# Patient Record
Sex: Male | Born: 1940 | Race: White | Hispanic: No | Marital: Married | Smoking: Never smoker
Health system: Southern US, Community
[De-identification: ages and names within clinical notes are randomized; demographics above are authoritative.]

## PROBLEM LIST (undated history)

## (undated) DIAGNOSIS — A048 Other specified bacterial intestinal infections: Secondary | ICD-10-CM

## (undated) DIAGNOSIS — K209 Esophagitis, unspecified: Secondary | ICD-10-CM

## (undated) DIAGNOSIS — T7840XA Allergy, unspecified, initial encounter: Secondary | ICD-10-CM

## (undated) DIAGNOSIS — N200 Calculus of kidney: Secondary | ICD-10-CM

## (undated) DIAGNOSIS — J309 Allergic rhinitis, unspecified: Secondary | ICD-10-CM

## (undated) DIAGNOSIS — I10 Essential (primary) hypertension: Secondary | ICD-10-CM

## (undated) DIAGNOSIS — K5790 Diverticulosis of intestine, part unspecified, without perforation or abscess without bleeding: Secondary | ICD-10-CM

## (undated) DIAGNOSIS — E785 Hyperlipidemia, unspecified: Secondary | ICD-10-CM

## (undated) DIAGNOSIS — K219 Gastro-esophageal reflux disease without esophagitis: Secondary | ICD-10-CM

## (undated) DIAGNOSIS — D369 Benign neoplasm, unspecified site: Secondary | ICD-10-CM

## (undated) DIAGNOSIS — H269 Unspecified cataract: Secondary | ICD-10-CM

## (undated) DIAGNOSIS — K2980 Duodenitis without bleeding: Secondary | ICD-10-CM

## (undated) HISTORY — DX: Allergic rhinitis, unspecified: J30.9

## (undated) HISTORY — DX: Benign neoplasm, unspecified site: D36.9

## (undated) HISTORY — DX: Allergy, unspecified, initial encounter: T78.40XA

## (undated) HISTORY — PX: COLONOSCOPY: SHX174

## (undated) HISTORY — PX: TONSILLECTOMY: SUR1361

## (undated) HISTORY — DX: Calculus of kidney: N20.0

## (undated) HISTORY — DX: Esophagitis, unspecified: K20.9

## (undated) HISTORY — DX: Essential (primary) hypertension: I10

## (undated) HISTORY — DX: Duodenitis without bleeding: K29.80

## (undated) HISTORY — DX: Other specified bacterial intestinal infections: A04.8

## (undated) HISTORY — DX: Diverticulosis of intestine, part unspecified, without perforation or abscess without bleeding: K57.90

## (undated) HISTORY — DX: Unspecified cataract: H26.9

## (undated) HISTORY — DX: Hyperlipidemia, unspecified: E78.5

## (undated) HISTORY — DX: Gastro-esophageal reflux disease without esophagitis: K21.9

---

## 1998-11-07 ENCOUNTER — Ambulatory Visit (HOSPITAL_COMMUNITY): Admission: RE | Admit: 1998-11-07 | Discharge: 1998-11-07 | Payer: Self-pay | Admitting: Family Medicine

## 1998-11-07 ENCOUNTER — Encounter: Payer: Self-pay | Admitting: Family Medicine

## 2000-06-27 ENCOUNTER — Encounter: Payer: Self-pay | Admitting: Neurosurgery

## 2000-06-27 ENCOUNTER — Ambulatory Visit (HOSPITAL_COMMUNITY): Admission: RE | Admit: 2000-06-27 | Discharge: 2000-06-27 | Payer: Self-pay | Admitting: Neurosurgery

## 2003-09-22 HISTORY — PX: NECK SURGERY: SHX720

## 2004-07-30 ENCOUNTER — Ambulatory Visit (HOSPITAL_COMMUNITY): Admission: RE | Admit: 2004-07-30 | Discharge: 2004-07-30 | Payer: Self-pay | Admitting: Family Medicine

## 2004-08-12 ENCOUNTER — Ambulatory Visit (HOSPITAL_COMMUNITY): Admission: RE | Admit: 2004-08-12 | Discharge: 2004-08-13 | Payer: Self-pay | Admitting: Neurosurgery

## 2011-11-11 ENCOUNTER — Other Ambulatory Visit: Payer: Self-pay | Admitting: Neurosurgery

## 2011-11-11 DIAGNOSIS — M542 Cervicalgia: Secondary | ICD-10-CM

## 2011-11-11 DIAGNOSIS — M5412 Radiculopathy, cervical region: Secondary | ICD-10-CM

## 2011-11-12 ENCOUNTER — Ambulatory Visit
Admission: RE | Admit: 2011-11-12 | Discharge: 2011-11-12 | Disposition: A | Discharge status: Home / Self Care | Payer: Medicare Other | Source: Ambulatory Visit | Attending: Neurosurgery | Admitting: Neurosurgery

## 2011-11-12 DIAGNOSIS — M542 Cervicalgia: Secondary | ICD-10-CM

## 2011-11-12 DIAGNOSIS — M5412 Radiculopathy, cervical region: Secondary | ICD-10-CM

## 2011-11-16 ENCOUNTER — Other Ambulatory Visit: Payer: Self-pay

## 2012-10-07 ENCOUNTER — Other Ambulatory Visit: Payer: Self-pay | Admitting: Family Medicine

## 2012-10-07 ENCOUNTER — Ambulatory Visit
Admission: RE | Admit: 2012-10-07 | Discharge: 2012-10-07 | Disposition: A | Discharge status: Home / Self Care | Payer: Medicare Other | Source: Ambulatory Visit | Attending: Family Medicine | Admitting: Family Medicine

## 2012-10-07 DIAGNOSIS — R1011 Right upper quadrant pain: Secondary | ICD-10-CM

## 2012-10-07 DIAGNOSIS — R194 Change in bowel habit: Secondary | ICD-10-CM

## 2012-10-11 ENCOUNTER — Encounter: Payer: Self-pay | Admitting: Internal Medicine

## 2012-10-22 DIAGNOSIS — K2980 Duodenitis without bleeding: Secondary | ICD-10-CM

## 2012-10-22 DIAGNOSIS — K209 Esophagitis, unspecified without bleeding: Secondary | ICD-10-CM

## 2012-10-22 HISTORY — DX: Esophagitis, unspecified without bleeding: K20.90

## 2012-10-22 HISTORY — DX: Duodenitis without bleeding: K29.80

## 2012-10-24 ENCOUNTER — Encounter: Payer: Self-pay | Admitting: *Deleted

## 2012-11-03 ENCOUNTER — Ambulatory Visit: Payer: Medicare Other | Admitting: Internal Medicine

## 2012-11-07 ENCOUNTER — Ambulatory Visit (INDEPENDENT_AMBULATORY_CARE_PROVIDER_SITE_OTHER): Payer: Medicare Other | Admitting: Internal Medicine

## 2012-11-07 ENCOUNTER — Other Ambulatory Visit (INDEPENDENT_AMBULATORY_CARE_PROVIDER_SITE_OTHER): Payer: Medicare Other

## 2012-11-07 ENCOUNTER — Encounter: Payer: Self-pay | Admitting: Internal Medicine

## 2012-11-07 VITALS — BP 164/86 | HR 72 | Ht 67.0 in | Wt 170.2 lb

## 2012-11-07 DIAGNOSIS — R6889 Other general symptoms and signs: Secondary | ICD-10-CM

## 2012-11-07 DIAGNOSIS — R198 Other specified symptoms and signs involving the digestive system and abdomen: Secondary | ICD-10-CM

## 2012-11-07 DIAGNOSIS — R110 Nausea: Secondary | ICD-10-CM

## 2012-11-07 DIAGNOSIS — R194 Change in bowel habit: Secondary | ICD-10-CM

## 2012-11-07 LAB — CBC WITH DIFFERENTIAL/PLATELET
Basophils Relative: 0.6 % (ref 0.0–3.0)
Eosinophils Relative: 1.3 % (ref 0.0–5.0)
HCT: 44.4 % (ref 39.0–52.0)
Hemoglobin: 15.1 g/dL (ref 13.0–17.0)
Lymphs Abs: 2.8 10*3/uL (ref 0.7–4.0)
MCV: 98.4 fl (ref 78.0–100.0)
Monocytes Absolute: 0.4 10*3/uL (ref 0.1–1.0)
Neutro Abs: 2.9 10*3/uL (ref 1.4–7.7)
Platelets: 244 10*3/uL (ref 150.0–400.0)
WBC: 6.3 10*3/uL (ref 4.5–10.5)

## 2012-11-07 MED ORDER — OMEPRAZOLE 20 MG PO CPDR: 20.0000 mg | DELAYED_RELEASE_CAPSULE | Freq: Every day | ORAL | Status: DC

## 2012-11-07 MED ORDER — ONDANSETRON HCL 4 MG PO TABS: 4.0000 mg | ORAL_TABLET | Freq: Three times a day (TID) | ORAL | Status: DC | PRN

## 2012-11-07 MED ORDER — MOVIPREP 100 G PO SOLR: 1.0000 | Freq: Once | ORAL | Status: DC

## 2012-11-07 NOTE — Progress Notes (Signed)
Aaron Lang 02-21-1941 MRN [id number]   History of Present Illness:  This is a 72 year old healthy appearing white male who has had 3 months history of nausea, initially in the morning but it has now progressed to nausea during the day. His symptoms are not associated with meals. His appetite has remained good and his weight remains stable. He denies any associated symptoms such as abdominal pain, fever, chest pain or dysphagia. He has had mild changes in his bowel habits from one bowel movement a day to 2-3 bowel movements a day which are soft but there is no diarrhea. He has not taken any antibiotics recently. Stool Hemoccults have been negative. His upper abdominal ultrasound showed mild fatty liver but was otherwise normal. He does not smoke or drink alcohol. He does not take any anti-inflammatory agents. He has changed his diet recently to eating more nuts. His medications have been unchanged. He has never had a colonoscopy. He was told to have an ulcer when he was in college and was treated with Tagamet.    Past Medical History  Diagnosis Date  . GERD (gastroesophageal reflux disease)   . Kidney stones   . HLD (hyperlipidemia)   . Allergic rhinitis    Past Surgical History  Procedure Laterality Date  . Neck surgery    . Tonsillectomy      reports that he has never smoked. He has never used smokeless tobacco. He reports that  drinks alcohol. He reports that he does not use illicit drugs. family history includes Diabetes in his brother and mother and Heart disease in his father. No Known Allergies      Review of Systems:Negative for dysphagia abdominal pain weight loss or rectal bleeding  The remainder of the 10 point ROS is negative except as outlined in H&P   Physical Exam: General appearance  Well developed, in no distress. Eyes- non icteric. HEENT nontraumatic, normocephalic. Mouth no lesions, tongue papillated, no cheilosis. Neck supple without adenopathy, thyroid not  enlarged, no carotid bruits, no JVD. Lungs Clear to auscultation bilaterally. Cor normal S1, normal S2, regular rhythm, no murmur,  quiet precordium. Abdomen: Soft relaxed abdomen with normoactive bowel sounds. No tenderness. Liver edge at costal margin. No bruit. Lower abdomen unremarkable. No palpable mass.  Rectal:Small amount of soft Hemoccult negative stool.  Extremities no pedal edema. Skin no lesions. Neurological alert and oriented x 3. Psychological normal mood and affect.  Assessment and Plan:  Problem #62 72 year old white male with 3 months history of nonspecific GI symptoms of nausea without vomiting and mild change in his bowel habits to frequent stools. He is Hemoccult negative. We have discussed the possibility of irritable bowel syndrome or gastroesophageal reflux not controlled on ranitidine. There is also the possibility of bacterial overgrowth causing change in his bowel habits. H. pylori gastritis is another possibility. Symptomatic diverticulosis is a consideration as well. We will check his sprue profile today to rule out a gluten allergy. We will also draw a CBC, sedimentation rate and TSH. We will schedule him for an upper endoscopy with small bowel biopsies and a colonoscopy. I have given him samples of Prilosec 20 mg to take in place of ranitidine. Depending on the findings of the upper and lower endoscopy, we will make further recommendations.   11/07/2012 Aaron Lang

## 2012-11-07 NOTE — Patient Instructions (Addendum)
You have been scheduled for an endoscopy and colonoscopy with propofol. Please follow the written instructions given to you at your visit today. Please pick up your prep at the pharmacy within the next 1-3 days. If you use inhalers (even only as needed) or a CPAP machine, please bring them with you on the day of your procedure.  Your physician has requested that you go to the basement for the following lab work before leaving today: Celiac Panel, CBC, B12, Sed Rate, TSH  We have sent the following medications to your pharmacy for you to pick up at your convenience: Zofran  CC: Dr Windle Guard

## 2012-11-07 NOTE — Addendum Note (Signed)
Addended by: Richardson Chiquito on: 11/07/2012 02:16 PM   Modules accepted: Orders

## 2012-11-08 ENCOUNTER — Encounter: Payer: Self-pay | Admitting: Internal Medicine

## 2012-11-08 ENCOUNTER — Encounter: Payer: Medicare Other | Admitting: Internal Medicine

## 2012-11-08 ENCOUNTER — Ambulatory Visit (AMBULATORY_SURGERY_CENTER): Payer: Medicare Other | Admitting: Internal Medicine

## 2012-11-08 VITALS — BP 159/97 | HR 62 | Temp 98.5°F | Resp 15 | Ht 67.0 in | Wt 170.0 lb

## 2012-11-08 DIAGNOSIS — D126 Benign neoplasm of colon, unspecified: Secondary | ICD-10-CM

## 2012-11-08 DIAGNOSIS — K2970 Gastritis, unspecified, without bleeding: Secondary | ICD-10-CM

## 2012-11-08 DIAGNOSIS — Z1211 Encounter for screening for malignant neoplasm of colon: Secondary | ICD-10-CM

## 2012-11-08 DIAGNOSIS — R198 Other specified symptoms and signs involving the digestive system and abdomen: Secondary | ICD-10-CM

## 2012-11-08 DIAGNOSIS — K2990 Gastroduodenitis, unspecified, without bleeding: Secondary | ICD-10-CM

## 2012-11-08 DIAGNOSIS — K2960 Other gastritis without bleeding: Secondary | ICD-10-CM

## 2012-11-08 DIAGNOSIS — K2980 Duodenitis without bleeding: Secondary | ICD-10-CM

## 2012-11-08 DIAGNOSIS — R6889 Other general symptoms and signs: Secondary | ICD-10-CM

## 2012-11-08 DIAGNOSIS — R110 Nausea: Secondary | ICD-10-CM

## 2012-11-08 LAB — CELIAC PANEL 10: Tissue Transglutaminase Ab, IgA: 5.8 U/mL (ref <20)

## 2012-11-08 MED ORDER — SUCRALFATE 1 G PO TABS: 1.0000 g | ORAL_TABLET | Freq: Two times a day (BID) | ORAL | Status: DC

## 2012-11-08 MED ORDER — OMEPRAZOLE 20 MG PO CPDR: 20.0000 mg | DELAYED_RELEASE_CAPSULE | Freq: Two times a day (BID) | ORAL | Status: DC

## 2012-11-08 MED ORDER — SODIUM CHLORIDE 0.9 % IV SOLN: 500.0000 mL | INTRAVENOUS | Status: DC

## 2012-11-08 NOTE — Patient Instructions (Signed)
YOU HAD AN ENDOSCOPIC PROCEDURE TODAY AT THE [facility]: Refer to the procedure report that was given to you for any specific questions about what was found during the examination.  If the procedure report does not answer your questions, please call your gastroenterologist to clarify.  If you requested that your care partner not be given the details of your procedure findings, then the procedure report has been included in a sealed envelope for you to review at your convenience later.  YOU SHOULD EXPECT: Some feelings of bloating in the abdomen. Passage of more gas than usual.  Walking can help get rid of the air that was put into your GI tract during the procedure and reduce the bloating. If you had a lower endoscopy (such as a colonoscopy or flexible sigmoidoscopy) you may notice spotting of blood in your stool or on the toilet paper. If you underwent a bowel prep for your procedure, then you may not have a normal bowel movement for a few days.  DIET: Your first meal following the procedure should be a light meal and then it is ok to progress to your normal diet.  A half-sandwich or bowl of soup is an example of a good first meal.  Heavy or fried foods are harder to digest and may make you feel nauseous or bloated.  Likewise meals heavy in dairy and vegetables can cause extra gas to form and this can also increase the bloating.  Drink plenty of fluids but you should avoid alcoholic beverages for 24 hours.  ACTIVITY: Your care partner should take you home directly after the procedure.  You should plan to take it easy, moving slowly for the rest of the day.  You can resume normal activity the day after the procedure however you should NOT DRIVE or use heavy machinery for 24 hours (because of the sedation medicines used during the test).    SYMPTOMS TO REPORT IMMEDIATELY: A gastroenterologist can be reached at any hour.  During normal business hours, 8:30 AM to 5:00 PM Monday through Friday,  call [phone].  After hours and on weekends, please call the GI answering service at [phone] who will take a message and have the physician on call contact you.   Following lower endoscopy (colonoscopy or flexible sigmoidoscopy):  Excessive amounts of blood in the stool  Significant tenderness or worsening of abdominal pains  Swelling of the abdomen that is new, acute  Fever of 100F or higher  Following upper endoscopy (EGD)  Vomiting of blood or coffee ground material  New chest pain or pain under the shoulder blades  Painful or persistently difficult swallowing  New shortness of breath  Fever of 100F or higher  Black, tarry-looking stools  FOLLOW UP: If any biopsies were taken you will be contacted by phone or by letter within the next 1-3 weeks.  Call your gastroenterologist if you have not heard about the biopsies in 3 weeks.  Our staff will call the home number listed on your records the next business day following your procedure to check on you and address any questions or concerns that you may have at that time regarding the information given to you following your procedure. This is a courtesy call and so if there is no answer at the home number and we have not heard from you through the emergency physician on call, we will assume that you have returned to your regular daily activities without incident.  SIGNATURES/CONFIDENTIALITY: You and/or your care   partner have signed paperwork which will be entered into your electronic medical record.  These signatures attest to the fact that that the information above on your After Visit Summary has been reviewed and is understood.  Full responsibility of the confidentiality of this discharge information lies with you and/or your care-partner.  

## 2012-11-08 NOTE — Progress Notes (Signed)
Patient did not experience any of the following events: a burn prior to discharge; a fall within the facility; wrong site/side/patient/procedure/implant event; or a hospital transfer or hospital admission upon discharge from the facility. (G8907) Patient did not have preoperative order for IV antibiotic SSI prophylaxis. (G8918)  

## 2012-11-08 NOTE — Progress Notes (Signed)
Called to room to assist during endoscopic procedure.  Patient ID and intended procedure confirmed with present staff. Received instructions for my participation in the procedure from the performing physician.  

## 2012-11-08 NOTE — Op Note (Signed)
[facility] [address] [place], [address]   COLONOSCOPY PROCEDURE REPORT  PATIENT: Aaron Lang, Aaron Lang  MR#: [id number] BIRTHDATE: [birth year] , 72  yrs. old GENDER: Male ENDOSCOPIST: Hart Carwin, MD REFERRED BY:  Windle Guard, M.D. PROCEDURE DATE:  11/08/2012 PROCEDURE:   Colonoscopy with cold biopsy polypectomy and Colonoscopy with snare polypectomy ASA CLASS:   Class II INDICATIONS:Average risk patient for colon cancer. MEDICATIONS: MAC sedation, administered by CRNA and propofol (Diprivan) 300mg  IV  DESCRIPTION OF PROCEDURE:   After the risks and benefits and of the procedure were explained, informed consent was obtained.  A digital rectal exam revealed no abnormalities of the rectum.    The LB CF-H180AL [id number]  endoscope was introduced through the anus and advanced to the cecum, which was identified by both the appendix and ileocecal valve .  The quality of the prep was good, using MoviPrep .  The instrument was then slowly withdrawn as the colon was fully examined.     COLON FINDINGS: Six  sessile polyps with friable surfaces were found at the cecum and in the ascending colon., sizes range from 3mm x3 polyps, 15 mm x 2 polyps and 5 mm  on the ileo cecal valve.  A polypectomy was performed using snare cautery, with a cold snare and with cold forceps.  The resection was complete and the polyp tissue was completely retrieved.   There was moderate diverticulosis noted in the sigmoid colon with associated tortuosity, muscular hypertrophy, colonic narrowing and angulation. Retroflexed views revealed no abnormalities.     The scope was then withdrawn from the patient and the procedure completed.  COMPLICATIONS: There were no complications. ENDOSCOPIC IMPRESSION: 1.   Six sessile polyps were found at the cecum and in the ascending colon; polypectomy was performed using snare cautery, with a cold snare and with cold forceps 2.   There was moderate  diverticulosis noted in the sigmoid colon  RECOMMENDATIONS: 1.  Await pathology results 2.  High fiber diet starting tomorrow, full liquids today, watch for bleeding and abdominal pain 3. bedin metamucil 2 tsp daily starting next week 4. OV 4 weeks    REPEAT EXAM: for Colonoscopy, pending pathology results.  cc:  _______________________________ eSignedHart Carwin, MD 11/08/2012 5:36 PM     PATIENT NAME:  [name], [name] MR#: [id number]

## 2012-11-08 NOTE — Op Note (Signed)
[facility] [address] [place], [address]   ENDOSCOPY PROCEDURE REPORT  PATIENT: Aaron, Lang  MR#: [id number] BIRTHDATE: May 30, 1941 , 72  yrs. old GENDER: Male ENDOSCOPIST: Hart Carwin, MD REFERRED BY:  Windle Guard, M.D. PROCEDURE DATE:  11/08/2012 PROCEDURE:  EGD w/ biopsy ASA CLASS:     Class II INDICATIONS:  Nausea.   3 month hx of nausea, no vomiting or abdominal pain,change in bowl habits,. MEDICATIONS: MAC sedation, administered by CRNA and propofol (Diprivan) 200mg  IV TOPICAL ANESTHETIC: none  DESCRIPTION OF PROCEDURE: After the risks benefits and alternatives of the procedure were thoroughly explained, informed consent was obtained.  The LB GIF-H180 [id number] endoscope was introduced through the mouth and advanced to the second portion of the duodenum. Without limitations.  The instrument was slowly withdrawn as the mucosa was fully examined.      esophagus: Esophageal mucosa appears normal in proximal and midesophagus. There were several short erosions at the GE junction consistent with grade 1 esophagitis, there was no stricture[ stomach: Endoscope passed into the stomach and stomach was insufflated with air. There was a diffuse pangastritis and the mucosal hemorrhages. Multiple biopsies were taken from gastric antrum. There was no discrete ulcer. Pyloric outlet was normal. Retroflexion of the endoscope revealed the gastritis in the fundus and cardia there were no varices. Duodenum: Duodenal bulb showed mild edema and duodenitis the dissection portion duodenum was normal. Multiple biopsies were taken from the descending  duodenum and from the duodenal bulb to r/o H.pylori       The scope was then withdrawn from the patient and the procedure completed.  COMPLICATIONS: There were no complications. ENDOSCOPIC IMPRESSION:  grade 1 reflux esophagitis moderately severe pangastritis status post multiple biopsies Mild duodenitis status  post biopsies to rule out H. pylori The above findings the may be responsible for patient's symptoms of nausea RECOMMENDATIONS:  increase Prilosec to 20 mg twice a day await results of the biopsies, Add Carafate 1 g by mouth twice a day Continue Zofran 4 mg every 6-8 hours when necessary Proceed with colonoscopy  REPEAT EXAM: no  eSigned:  Hart Carwin, MD 11/08/2012 5:19 PM   CC:  PATIENT NAME:  Aaron, Lang MR#: [id number]

## 2012-11-09 ENCOUNTER — Telehealth: Payer: Self-pay | Admitting: *Deleted

## 2012-11-09 NOTE — Telephone Encounter (Signed)
No answer, left message to call if questions or concerns. 

## 2012-11-10 ENCOUNTER — Telehealth: Payer: Self-pay | Admitting: Internal Medicine

## 2012-11-10 ENCOUNTER — Encounter: Payer: Self-pay | Admitting: *Deleted

## 2012-11-10 NOTE — Telephone Encounter (Signed)
I have spoken to the patient, he had 3 bloody stools earlier  today, has stayed on soup, ne pain,  I have asked him to call in am [facility] with status _ if more bleeding, please have him come back for CBC.

## 2012-11-10 NOTE — Telephone Encounter (Signed)
Patient calling to report he is still having a small amount of blood in stool. Reports the blood is in the toilet only and not when he wipes. Also, still having loose stools(ECOL on 11/08/12) Also, reports that today his nausea has returned and is extreme. He took a Zofran without relief. Please, advise.

## 2012-11-11 NOTE — Telephone Encounter (Signed)
Spoke with patient he had a stool this AM and he did not see blood in it. He also states his temperature is back to normal this morning. He understands to call me back if he sees any blood in stools today.

## 2012-11-14 ENCOUNTER — Encounter: Payer: Self-pay | Admitting: Internal Medicine

## 2012-11-15 ENCOUNTER — Other Ambulatory Visit: Payer: Self-pay | Admitting: *Deleted

## 2012-11-15 MED ORDER — CLARITHROMYCIN 500 MG PO TABS: ORAL_TABLET | ORAL | Status: DC

## 2012-11-15 MED ORDER — AMOXICILLIN 500 MG PO CAPS: ORAL_CAPSULE | ORAL | Status: DC

## 2012-11-21 ENCOUNTER — Encounter: Payer: Self-pay | Admitting: *Deleted

## 2012-12-07 ENCOUNTER — Ambulatory Visit: Payer: Medicare Other | Admitting: Internal Medicine

## 2012-12-09 ENCOUNTER — Ambulatory Visit: Payer: Medicare Other | Admitting: Internal Medicine

## 2012-12-14 ENCOUNTER — Ambulatory Visit (INDEPENDENT_AMBULATORY_CARE_PROVIDER_SITE_OTHER): Payer: Medicare Other | Admitting: Internal Medicine

## 2012-12-14 ENCOUNTER — Encounter: Payer: Self-pay | Admitting: Internal Medicine

## 2012-12-14 VITALS — BP 138/98 | HR 69 | Ht 67.0 in | Wt 179.0 lb

## 2012-12-14 DIAGNOSIS — K2960 Other gastritis without bleeding: Secondary | ICD-10-CM

## 2012-12-14 DIAGNOSIS — R112 Nausea with vomiting, unspecified: Secondary | ICD-10-CM

## 2012-12-14 MED ORDER — SUCRALFATE 1 G PO TABS: 1.0000 g | ORAL_TABLET | Freq: Two times a day (BID) | ORAL | Status: DC

## 2012-12-14 NOTE — Progress Notes (Signed)
Aaron Lang Nov 30, 1940 MRN [id number]   History of Present Illness:  This is a 73 year old white male with chronic nausea without vomiting who was found to have H. pylori gastritis on an upper endoscopy 11/08/2012. He also had duodenitis. He was treated with triple therapy and has become symptomatically improved after taking the antibiotics for 10 days. He is now about 80% improved. His nausea occurs only rarely and only in the mornings. It is usually responsive to Carafate 1 g by mouth twice a day.   Past Medical History  Diagnosis Date  . GERD (gastroesophageal reflux disease)   . Kidney stones   . HLD (hyperlipidemia)   . Allergic rhinitis   . Esophagitis 10/2012  . Duodenitis 10/2012  . Diverticulosis   . Tubular adenoma   . H. pylori infection    Past Surgical History  Procedure Laterality Date  . Neck surgery    . Tonsillectomy      reports that he has never smoked. He has never used smokeless tobacco. He reports that  drinks alcohol. He reports that he does not use illicit drugs. family history includes Diabetes in his brother and mother and Heart disease in his father. No Known Allergies      Review of Systems: Denies vomiting. No change in bowel habits  The remainder of the 10 point ROS is negative except as outlined in H&P   Physical Exam: General appearance  Well developed, in no distress. Eyes- non icteric. HEENT nontraumatic, normocephalic. Mouth no lesions, tongue papillated, no cheilosis. Neck supple without adenopathy, thyroid not enlarged, no carotid bruits, no JVD. Lungs Clear to auscultation bilaterally. Cor normal S1, normal S2, regular rhythm, no murmur,  quiet precordium. Abdomen: Soft nontender. Rectal: Not examined. Extremities no pedal edema. Skin no lesions. Neurological alert and oriented x 3. Psychological normal mood and affect.  Assessment and Plan:  Problem #52 72 year old white male with H. pylori gastritis and duodenitis improved on  H. pylori regimen. He still has some nausea and we need to know whether it is caused by H. pylori and if so, he may have to be retreated with another regimen. We will schedule a [employer] breath test to check for the effectiveness of the treatment. He needs a refill of Carafate. We will consider a gastric emptying scan in the future if symptoms return.   12/14/2012 [name]

## 2012-12-14 NOTE — Patient Instructions (Addendum)
We have sent the following medications to your pharmacy for you to pick up at your convenience: Carafate   You have been scheduled for an H. Pylori/Urea Breath Test. Your appointment is on 01/17/13 at 8:15 am. Please come to [facility] 3rd Floor for check in. Please plan on being here for around 20-30 minutes.  Please follow the instructions below to prepare for your test:  1. 01/16/13 (one day prior to your test) avoid high gas foods, high fiber goods, fruits and fruit juices, vegetables, beans, cereals, fiber supplements, onion and bell peppers. You may eat hamburger, chicken, beef, tuna fish and eggs.  2. You should have nothing to eat or drink after 9:00 pm on 01/16/13.   3. Do not smoke, chew gum or eat hard candy the morning of your test.  4. You may not have the test within 2 weeks after taking any antibiotics or within 4 weeks if confirming eradication of H.Pylori.  5. On 01/02/13 (2 weeks prior to your test), stop any Pepto-Bismol and/or proton pump inhibitors (Prevacid, Zegerid, Nexium, Prilosec, Protonix, Aciphex, Dexilant).  6. You may use over the counter strength Axid, Pepcid, Tagamet and Zantac 2 weeks prior to your tests. However, you should STOP these 24 hours before the test. You may use Tums and Maalox.  CC: Dr Windle Guard

## 2013-01-23 ENCOUNTER — Telehealth: Payer: Self-pay | Admitting: Internal Medicine

## 2013-01-23 NOTE — Telephone Encounter (Signed)
Patient given results(negative) per report on MD desk.

## 2013-04-26 ENCOUNTER — Other Ambulatory Visit: Payer: Self-pay

## 2013-07-27 ENCOUNTER — Other Ambulatory Visit: Payer: Self-pay

## 2013-09-21 HISTORY — PX: BACK SURGERY: SHX140

## 2013-10-11 ENCOUNTER — Other Ambulatory Visit: Payer: Self-pay | Admitting: Family Medicine

## 2013-10-11 DIAGNOSIS — M7918 Myalgia, other site: Secondary | ICD-10-CM

## 2013-10-18 ENCOUNTER — Ambulatory Visit
Admission: RE | Admit: 2013-10-18 | Discharge: 2013-10-18 | Disposition: A | Discharge status: Home / Self Care | Payer: Medicare Other | Source: Ambulatory Visit | Attending: Family Medicine | Admitting: Family Medicine

## 2013-10-18 DIAGNOSIS — M7918 Myalgia, other site: Secondary | ICD-10-CM

## 2014-07-06 ENCOUNTER — Other Ambulatory Visit: Payer: Self-pay

## 2015-03-18 ENCOUNTER — Other Ambulatory Visit: Payer: Self-pay

## 2015-07-25 ENCOUNTER — Emergency Department (HOSPITAL_COMMUNITY): Payer: Medicare Other

## 2015-07-25 ENCOUNTER — Observation Stay (HOSPITAL_COMMUNITY)
Admission: EM | Admit: 2015-07-25 | Discharge: 2015-07-26 | Disposition: A | Discharge status: Home / Self Care | Payer: Medicare Other | Attending: Cardiovascular Disease | Admitting: Cardiovascular Disease

## 2015-07-25 ENCOUNTER — Encounter (HOSPITAL_COMMUNITY): Payer: Self-pay

## 2015-07-25 DIAGNOSIS — K219 Gastro-esophageal reflux disease without esophagitis: Secondary | ICD-10-CM | POA: Diagnosis not present

## 2015-07-25 DIAGNOSIS — R079 Chest pain, unspecified: Secondary | ICD-10-CM | POA: Diagnosis not present

## 2015-07-25 DIAGNOSIS — R0789 Other chest pain: Principal | ICD-10-CM | POA: Insufficient documentation

## 2015-07-25 DIAGNOSIS — K2980 Duodenitis without bleeding: Secondary | ICD-10-CM | POA: Diagnosis present

## 2015-07-25 DIAGNOSIS — E785 Hyperlipidemia, unspecified: Secondary | ICD-10-CM | POA: Diagnosis not present

## 2015-07-25 DIAGNOSIS — I209 Angina pectoris, unspecified: Secondary | ICD-10-CM | POA: Diagnosis present

## 2015-07-25 DIAGNOSIS — I10 Essential (primary) hypertension: Secondary | ICD-10-CM | POA: Diagnosis present

## 2015-07-25 DIAGNOSIS — I200 Unstable angina: Secondary | ICD-10-CM | POA: Diagnosis present

## 2015-07-25 DIAGNOSIS — R110 Nausea: Secondary | ICD-10-CM

## 2015-07-25 DIAGNOSIS — Z8619 Personal history of other infectious and parasitic diseases: Secondary | ICD-10-CM | POA: Diagnosis present

## 2015-07-25 LAB — CREATININE, SERUM: Creatinine, Ser: 1.05 mg/dL (ref 0.61–1.24)

## 2015-07-25 LAB — CBC
HEMATOCRIT: 40.6 % (ref 39.0–52.0)
HEMATOCRIT: 40.4 % (ref 39.0–52.0)
HEMOGLOBIN: 13.7 g/dL (ref 13.0–17.0)
Hemoglobin: 13.8 g/dL (ref 13.0–17.0)
MCH: 33.3 pg (ref 26.0–34.0)
MCH: 33.1 pg (ref 26.0–34.0)
MCHC: 34 g/dL (ref 30.0–36.0)
MCHC: 33.9 g/dL (ref 30.0–36.0)
MCV: 97.8 fL (ref 78.0–100.0)
MCV: 97.6 fL (ref 78.0–100.0)
Platelets: 222 10*3/uL (ref 150–400)
Platelets: 237 10*3/uL (ref 150–400)
RBC: 4.15 MIL/uL — AB (ref 4.22–5.81)
RBC: 4.14 MIL/uL — ABNORMAL LOW (ref 4.22–5.81)
RDW: 13.3 % (ref 11.5–15.5)
RDW: 13.3 % (ref 11.5–15.5)
WBC: 6.2 10*3/uL (ref 4.0–10.5)
WBC: 6.6 10*3/uL (ref 4.0–10.5)

## 2015-07-25 LAB — I-STAT TROPONIN, ED
TROPONIN I, POC: 0.01 ng/mL (ref 0.00–0.08)
Troponin i, poc: 0 ng/mL (ref 0.00–0.08)

## 2015-07-25 LAB — BASIC METABOLIC PANEL
Anion gap: 9 (ref 5–15)
BUN: 16 mg/dL (ref 6–20)
CHLORIDE: 108 mmol/L (ref 101–111)
CO2: 23 mmol/L (ref 22–32)
CREATININE: 1 mg/dL (ref 0.61–1.24)
Calcium: 9.4 mg/dL (ref 8.9–10.3)
GFR calc non Af Amer: >60 mL/min (ref >60)
Glucose, Bld: 116 mg/dL — ABNORMAL HIGH (ref 65–99)
POTASSIUM: 4.1 mmol/L (ref 3.5–5.1)
SODIUM: 140 mmol/L (ref 135–145)

## 2015-07-25 LAB — DIFFERENTIAL
Basophils Absolute: 0 10*3/uL (ref 0.0–0.1)
Basophils Relative: 0 %
Eosinophils Absolute: 0.1 10*3/uL (ref 0.0–0.7)
Eosinophils Relative: 2 %
LYMPHS ABS: 3.7 10*3/uL (ref 0.7–4.0)
LYMPHS PCT: 60 %
MONO ABS: 0.6 10*3/uL (ref 0.1–1.0)
MONOS PCT: 9 %
NEUTROS ABS: 1.8 10*3/uL (ref 1.7–7.7)
NEUTROS PCT: 29 %

## 2015-07-25 LAB — TROPONIN I
Troponin I: <0.03 ng/mL (ref <0.031)
Troponin I: <0.03 ng/mL (ref <0.031)

## 2015-07-25 MED ORDER — NITROGLYCERIN 0.4 MG SL SUBL
0.4000 mg | SUBLINGUAL_TABLET | SUBLINGUAL | Status: DC | PRN
Start: 2015-07-25 — End: 2015-07-25
  Administered 2015-07-25: 0.4 mg via SUBLINGUAL
  Filled 2015-07-25: qty 1

## 2015-07-25 MED ORDER — MORPHINE SULFATE (PF) 4 MG/ML IV SOLN
4.0000 mg | Freq: Once | INTRAVENOUS | Status: AC
  Administered 2015-07-25: 4 mg via INTRAVENOUS
  Filled 2015-07-25 (×2): qty 1

## 2015-07-25 MED ORDER — ONDANSETRON HCL 4 MG/2ML IJ SOLN
4.0000 mg | Freq: Once | INTRAMUSCULAR | Status: AC
  Administered 2015-07-25: 4 mg via INTRAVENOUS
  Filled 2015-07-25: qty 2

## 2015-07-25 MED ORDER — ASPIRIN EC 81 MG PO TBEC
324.0000 mg | DELAYED_RELEASE_TABLET | Freq: Once | ORAL | Status: AC
  Administered 2015-07-25: 324 mg via ORAL
  Filled 2015-07-25: qty 4

## 2015-07-25 MED ORDER — HEPARIN SODIUM (PORCINE) 5000 UNIT/ML IJ SOLN
5000.0000 [IU] | Freq: Three times a day (TID) | INTRAMUSCULAR | Status: DC
Start: 2015-07-25 — End: 2015-07-26
  Administered 2015-07-25 – 2015-07-26 (×2): 5000 [IU] via SUBCUTANEOUS
  Filled 2015-07-25 (×3): qty 1

## 2015-07-25 MED ORDER — ASPIRIN 81 MG PO CHEW: 324.0000 mg | CHEWABLE_TABLET | Freq: Once | ORAL | Status: DC

## 2015-07-25 MED ORDER — NITROGLYCERIN 2 % TD OINT
1.0000 [in_us] | TOPICAL_OINTMENT | Freq: Once | TRANSDERMAL | Status: AC
  Administered 2015-07-25: 1 [in_us] via TOPICAL
  Filled 2015-07-25: qty 1

## 2015-07-25 MED ORDER — ACETAMINOPHEN 325 MG PO TABS
650.0000 mg | ORAL_TABLET | ORAL | Status: DC | PRN
  Administered 2015-07-25: 650 mg via ORAL
  Filled 2015-07-25: qty 2

## 2015-07-25 MED ORDER — ADULT MULTIVITAMIN W/MINERALS CH
1.0000 | ORAL_TABLET | Freq: Every day | ORAL | Status: DC
  Administered 2015-07-25 – 2015-07-26 (×2): 1 via ORAL
  Filled 2015-07-25 (×3): qty 1

## 2015-07-25 MED ORDER — ONDANSETRON HCL 4 MG/2ML IJ SOLN
4.0000 mg | Freq: Once | INTRAMUSCULAR | Status: DC | PRN
  Filled 2015-07-25 (×2): qty 2

## 2015-07-25 MED ORDER — FAMOTIDINE IN NACL 20-0.9 MG/50ML-% IV SOLN
20.0000 mg | Freq: Once | INTRAVENOUS | Status: AC
  Administered 2015-07-25: 20 mg via INTRAVENOUS
  Filled 2015-07-25: qty 50

## 2015-07-25 MED ORDER — NITROGLYCERIN 0.4 MG SL SUBL
0.4000 mg | SUBLINGUAL_TABLET | SUBLINGUAL | Status: DC | PRN
  Filled 2015-07-25: qty 1

## 2015-07-25 MED ORDER — ONDANSETRON HCL 4 MG/2ML IJ SOLN: 4.0000 mg | Freq: Four times a day (QID) | INTRAMUSCULAR | Status: DC | PRN

## 2015-07-25 MED ORDER — GI COCKTAIL ~~LOC~~
30.0000 mL | Freq: Once | ORAL | Status: AC
  Administered 2015-07-25: 30 mL via ORAL
  Filled 2015-07-25: qty 30

## 2015-07-25 MED ORDER — PANTOPRAZOLE SODIUM 40 MG PO TBEC
40.0000 mg | DELAYED_RELEASE_TABLET | Freq: Every day | ORAL | Status: DC
  Administered 2015-07-25 – 2015-07-26 (×2): 40 mg via ORAL
  Filled 2015-07-25 (×2): qty 1

## 2015-07-25 NOTE — ED Notes (Signed)
Per pt he is having left sided chest pain that radiates to his left shoulder, left arm, and his jaw. Pt states that he does have GERD. Pt describes the pain as burning and feels different than his reflux. Pt did state that he has had nausea with this chest pain. Pt did take 4 baby aspirin around 4 am today.

## 2015-07-25 NOTE — ED Notes (Signed)
MD at bedside. 

## 2015-07-25 NOTE — ED Notes (Signed)
Patient transported to X-ray 

## 2015-07-25 NOTE — H&P (Signed)
Patient ID: Aaron Lang MRN: [id number], DOB/AGE: 73-Mar-1942   Admit date: 07/25/2015   Primary Physician: Leonard Downing, MD Primary Cardiologist: new - Dr. Oval Linsey  Pt. Profile:  52-year-old male with past medical history of hyperlipidemia, GERD, duodenitis, and H. pylori treated with triple therapy in 2014 but no cardiac history presented with CP  Problem List  Past Medical History  Diagnosis Date  . GERD (gastroesophageal reflux disease)   . Kidney stones   . HLD (hyperlipidemia)   . Allergic rhinitis   . Esophagitis 10/2012  . Duodenitis 10/2012  . Diverticulosis   . Tubular adenoma   . H. pylori infection     Past Surgical History  Procedure Laterality Date  . Neck surgery    . Tonsillectomy    . Back surgery      stenosis     Allergies  No Known Allergies  HPI  The patient is a pleasant 74 year old male with past medical history of hyperlipidemia, GERD, duodenitis, and H. pylori treated with triple therapy in 2014. He has no prior cardiac history, however has significant family history of heart disease as both his parents died of heart disease later in life. He denies any family history of early CAD before age 72. He has led very active lifestyle, however recently he has noticed some increasing lack of energy. In the past 6 month, he described occasional sensation of palpitation in the middle of the night when he's leaning on his left side, the symptom would resolve when he turned to the right side and would recur again when he leaned on the left side again. He has no prior history of arrhythmia. For the past week, he has noticed a burning sensation in the substernal area radiating to the left shoulder and jaw occurring both at rest and with exertion. The symptom is different from his usual acid reflux sensation or his past experience with duodenitis. Interestingly enough, it seems to occur early in the morning. He continued his active lifestyle, hiding the  fact from his wife that sometimes he has chest discomfort doing yard work. He did do a lot of yard work yesterday as well and had an episode during exertion. He denies any obvious exacerbating or alleviating factors.   This morning, he woke up around 2 AM with the same burning sensation. The difference is that this time it did not go away. He decided to seek medical attention at [facility]. CBC and BMP were normal. Troponin negative. Chest x-ray is showed mild bronchitic changes. EKG did not reveal any significant ST-T wave changes. He did receive some Nitro patch which improved to symptom, however symptoms later came back. Cardiology has been consulted for chest pain.    Home Medications  Prior to Admission medications   Medication Sig Start Date End Date Taking? Authorizing Provider  aspirin EC 81 MG tablet Take 324 mg by mouth once.   Yes Historical Provider, MD  Multiple Vitamin (MULTIVITAMIN) tablet Take 1 tablet by mouth daily.   Yes Historical Provider, MD  ranitidine (ZANTAC) 150 MG tablet Take 150 mg by mouth at bedtime.   Yes Historical Provider, MD  omeprazole (PRILOSEC) 20 MG capsule Take 1 capsule (20 mg total) by mouth 2 (two) times daily. Patient not taking: Reported on 07/25/2015 11/08/12   Lafayette Dragon, MD  sucralfate (CARAFATE) 1 G tablet Take 1 tablet (1 g total) by mouth 2 (two) times daily. Patient not taking: Reported on 07/25/2015 12/14/12  Lafayette Dragon, MD    Family History  Family History  Problem Relation Age of Onset  . Diabetes Mother   . Diabetes Brother   . Heart disease Father     Social History  Social History   Social History  . Marital Status: Married    Spouse Name: N/A  . Number of Children: 1  . Years of Education: N/A   Occupational History  . pharmacist    Social History Main Topics  . Smoking status: Never Smoker   . Smokeless tobacco: Never Used  . Alcohol Use: Yes     Comment: less than 1 per day  . Drug Use: No  . Sexual  Activity: Not on file   Other Topics Concern  . Not on file   Social History Narrative     Review of Systems General:  No chills, fever, night sweats or weight changes.  Cardiovascular:  No dyspnea on exertion, edema, orthopnea, paroxysmal nocturnal dyspnea. +chest pain, palpitations Dermatological: No rash, lesions/masses Respiratory: No cough, dyspnea Urologic: No hematuria, dysuria Abdominal:   No vomiting, diarrhea, bright red blood per rectum, melena, or hematemesis +nausea Neurologic:  No visual changes, wkns, changes in mental status. All other systems reviewed and are otherwise negative except as noted above.  Physical Exam  Blood pressure 129/69, pulse 66, temperature 98.4 F (36.9 C), temperature source Oral, resp. rate 16, height 5\' 7"  (1.702 m), weight 167 lb (75.751 kg), SpO2 99 %.  General: Pleasant, NAD Psych: Normal affect. Neuro: Alert and oriented X 3. Moves all extremities spontaneously. HEENT: Normal  Neck: Supple without bruits or JVD. Lungs:  Resp regular and unlabored, CTA. Heart: RRR no s3, s4, or murmurs. Abdomen: Soft, non-tender, non-distended, BS + x 4.  Extremities: No clubbing, cyanosis or edema. DP/PT/Radials 2+ and equal bilaterally.  Labs  Troponin [facility] of Care Test)  Recent Labs  07/25/15 0626  TROPIPOC 0.00   No results for input(s): CKTOTAL, CKMB, TROPONINI in the last 72 hours. Lab Results  Component Value Date   WBC 6.2 07/25/2015   HGB 13.8 07/25/2015   HCT 40.6 07/25/2015   MCV 97.8 07/25/2015   PLT 222 07/25/2015    Recent Labs Lab 07/25/15 0620  NA 140  K 4.1  CL 108  CO2 23  BUN 16  CREATININE 1.00  CALCIUM 9.4  GLUCOSE 116*     Radiology/Studies  Dg Chest 2 View  07/25/2015  CLINICAL DATA:  LEFT chest and jaw pain beginning tonight, shortness of breath and nausea. Similar symptoms 1 week ago with spontaneous resolution. History of hyperlipidemia. EXAM: CHEST  2 VIEW COMPARISON:  None. FINDINGS:  Cardiomediastinal silhouette is normal. Mild bronchitic changes without pleural effusion or focal consolidation. No pneumothorax. ACDF. Soft tissue planes and the included osseous structure are nonsuspicious. Partially imaged abdominal aortic calcifications. IMPRESSION: Mild bronchitic changes without focal consolidation. Electronically Signed   By: Elon Alas M.D.   On: 07/25/2015 06:35    ECG  NSR without significant ST-T wave changes   ASSESSMENT AND PLAN  1. Chest pain   - symptom certainly concerning for unstable angina, however now CP has been ongoing for 8 hours without any obvious trop rise.  - will obtain another trop in the ED now and 2 more afterward to trend  - will discuss with MD regarding cardiac catheterization vs myoview. Given his recent exertional symptom, may consider going directly to cardiac catheterization  - he did have some very atypical palpitation 6  month ago, but i don't think it is related to current symptom   2. Hyperlipidemia 3. GERD 4. H/o Duodenitis 5. H. pylori treated with triple therapy in 2014   Signed, Yoseph Haile, [place] 07/25/2015, 11:12 AM

## 2015-07-25 NOTE — ED Notes (Signed)
Currently patient states chest pressure 1/10 and left upper arm pain 4/10 achy dull and feels better. Currently does not want pain medication at this time. Wife at bedside.

## 2015-07-25 NOTE — ED Notes (Signed)
Notified Katharine Look on 3E pt is now pain free after medication

## 2015-07-25 NOTE — ED Provider Notes (Signed)
CSN: [id number]     Arrival date & time 07/25/15  0604 History   None    Chief Complaint  Patient presents with  . Chest Pain     (Consider location/radiation/quality/duration/timing/severity/associated sxs/prior Treatment) HPI   Aaron Lang is a 74 y.o. male, pt with history of GERD and dyslipidemia, presenting to ED with chest burning that woke him up this morning at around 3am along with pain in the left shoulder and left jaw. Pt rates chest pain 4/10, non-radiating. Pain in shoulder rated about 7/10 with the same description of burning. Accompanied by nausea. States it's a different pain than his reflux. Pt has not taken anything for the pain. Pt states he had a single occurrence of the same symptoms a week ago while driving. Pt states this resolved spontaneously. Pt states his current pain has not resolved or lessened since it began.  Past Medical History  Diagnosis Date  . GERD (gastroesophageal reflux disease)   . Kidney stones   . HLD (hyperlipidemia)   . Allergic rhinitis   . Esophagitis 10/2012  . Duodenitis 10/2012  . Diverticulosis   . Tubular adenoma   . H. pylori infection    Past Surgical History  Procedure Laterality Date  . Neck surgery    . Tonsillectomy    . Back surgery      stenosis   Family History  Problem Relation Age of Onset  . Diabetes Mother   . Diabetes Brother   . Heart disease Father    Social History  Substance Use Topics  . Smoking status: Never Smoker   . Smokeless tobacco: Never Used  . Alcohol Use: Yes     Comment: less than 1 per day    Review of Systems  Constitutional: Negative for fever, chills, diaphoresis and unexpected weight change.  Respiratory: Negative for cough, chest tightness and shortness of breath.   Cardiovascular: Positive for chest pain. Negative for palpitations and leg swelling.  Gastrointestinal: Positive for nausea. Negative for vomiting, abdominal pain, diarrhea and constipation.  Genitourinary: Negative  for dysuria, flank pain and difficulty urinating.  Musculoskeletal: Negative for back pain.  Skin: Negative for color change and pallor.  Neurological: Positive for headaches. Negative for dizziness, syncope, weakness and light-headedness.  All other systems reviewed and are negative.     Allergies  Review of patient's allergies indicates no known allergies.  Home Medications   Prior to Admission medications   Medication Sig Start Date End Date Taking? Authorizing Provider  aspirin EC 81 MG tablet Take 324 mg by mouth once.   Yes Historical Provider, MD  Multiple Vitamin (MULTIVITAMIN) tablet Take 1 tablet by mouth daily.   Yes Historical Provider, MD  ranitidine (ZANTAC) 150 MG tablet Take 150 mg by mouth at bedtime.   Yes Historical Provider, MD  omeprazole (PRILOSEC) 20 MG capsule Take 1 capsule (20 mg total) by mouth 2 (two) times daily. Patient not taking: Reported on 07/25/2015 11/08/12   Lafayette Dragon, MD  sucralfate (CARAFATE) 1 G tablet Take 1 tablet (1 g total) by mouth 2 (two) times daily. Patient not taking: Reported on 07/25/2015 12/14/12   Lafayette Dragon, MD   BP 123/72 mmHg  Pulse 64  Temp(Src) 98.4 F (36.9 C) (Oral)  Resp 16  Ht 5\' 7"  (1.702 m)  Wt 167 lb (75.751 kg)  BMI 26.15 kg/m2  SpO2 97% Physical Exam  Constitutional: He appears well-developed and well-nourished. No distress.  HENT:  Head: Normocephalic and atraumatic.  Eyes: Conjunctivae are normal. Pupils are equal, round, and reactive to light.  Cardiovascular: Normal rate, regular rhythm and normal heart sounds.   Pulmonary/Chest: Effort normal and breath sounds normal. No respiratory distress.  Pain does not increase with deep breath. Pain not reproducible with palpation.   Abdominal: Soft. Bowel sounds are normal.  Musculoskeletal: He exhibits no edema or tenderness.  Neurological: He is alert.  Skin: Skin is warm and dry. He is not diaphoretic.  Nursing note and vitals reviewed.   ED Course   Procedures (including critical care time) Labs Review Labs Reviewed  BASIC METABOLIC PANEL - Abnormal; Notable for the following:    Glucose, Bld 116 (*)    All other components within normal limits  CBC - Abnormal; Notable for the following:    RBC 4.15 (*)    All other components within normal limits  DIFFERENTIAL  TROPONIN I  TROPONIN I  TROPONIN I  I-STAT TROPOININ, ED  I-STAT TROPOININ, ED  Randolm Idol, ED    Imaging Review Dg Chest 2 View  07/25/2015  CLINICAL DATA:  LEFT chest and jaw pain beginning tonight, shortness of breath and nausea. Similar symptoms 1 week ago with spontaneous resolution. History of hyperlipidemia. EXAM: CHEST  2 VIEW COMPARISON:  None. FINDINGS: Cardiomediastinal silhouette is normal. Mild bronchitic changes without pleural effusion or focal consolidation. No pneumothorax. ACDF. Soft tissue planes and the included osseous structure are nonsuspicious. Partially imaged abdominal aortic calcifications. IMPRESSION: Mild bronchitic changes without focal consolidation. Electronically Signed   By: Elon Alas M.D.   On: 07/25/2015 06:35   I have personally reviewed and evaluated these images and lab results as part of my medical decision-making.   EKG Interpretation   Date/Time:  Thursday July 25 2015 06:08:48 EDT Ventricular Rate:  71 PR Interval:  165 QRS Duration: 98 QT Interval:  401 QTC Calculation: 436 R Axis:   61 Text Interpretation:  Sinus rhythm Abnormal R-wave progression, early  transition No significant change since last tracing Confirmed by Glynn Octave [phone]) on 07/25/2015 6:29:57 AM      MDM   Final diagnoses:  Chest pain, unspecified chest pain type  Nausea    Aaron Lang presents with chest pain and pain in his left jaw and shoulder that woke him up from sleep at 3am this morning.  Findings and plan of care discussed with Ezequiel Essex, MD.  Troponin negative. Chest xray non-specific. CBC and  BMP without significant abnormalities. HEART score is moderate risk. Wells criteria is also low risk.  7:27 AM Pt states pain has decreased with nitro to about 6/10 all around. Cardiology consult ordered due to pt moderate risk. Expect for pt to be observed overnight. 8:28 AM Pt states his pain is "a little better" and his nausea has resolved. Lab and EKG findings communicated to pt.  9:18 AM Spoke with Trish from Cardiology on the phone. States she will come see pt here in the ED.  10:38 AM Pt states his pain is worse again, back to its original level. Pt was offered pain medication, but he declined.  States the Pepcid did not help. No nausea currently. 12:15 PM Pt reassessed. Pain is feeling better. No additional symptoms. Dr. Oval Linsey from Cardiology will see pt and plans to keep him for observation at least overnight.   Lorayne Bender, PA-C 07/25/15 [place], MD 07/25/15 [phone]

## 2015-07-26 ENCOUNTER — Encounter (HOSPITAL_COMMUNITY)
Admission: EM | Disposition: A | Discharge status: Home / Self Care | Payer: Self-pay | Source: Home / Self Care | Attending: Emergency Medicine

## 2015-07-26 ENCOUNTER — Other Ambulatory Visit: Payer: Self-pay

## 2015-07-26 DIAGNOSIS — Z8619 Personal history of other infectious and parasitic diseases: Secondary | ICD-10-CM | POA: Diagnosis present

## 2015-07-26 DIAGNOSIS — I209 Angina pectoris, unspecified: Secondary | ICD-10-CM | POA: Diagnosis present

## 2015-07-26 DIAGNOSIS — I2510 Atherosclerotic heart disease of native coronary artery without angina pectoris: Secondary | ICD-10-CM

## 2015-07-26 DIAGNOSIS — R0789 Other chest pain: Secondary | ICD-10-CM | POA: Diagnosis not present

## 2015-07-26 DIAGNOSIS — I200 Unstable angina: Secondary | ICD-10-CM | POA: Diagnosis present

## 2015-07-26 DIAGNOSIS — K219 Gastro-esophageal reflux disease without esophagitis: Secondary | ICD-10-CM | POA: Diagnosis not present

## 2015-07-26 DIAGNOSIS — K2980 Duodenitis without bleeding: Secondary | ICD-10-CM | POA: Diagnosis present

## 2015-07-26 DIAGNOSIS — E785 Hyperlipidemia, unspecified: Secondary | ICD-10-CM | POA: Diagnosis not present

## 2015-07-26 DIAGNOSIS — I10 Essential (primary) hypertension: Secondary | ICD-10-CM

## 2015-07-26 HISTORY — PX: CARDIAC CATHETERIZATION: SHX172

## 2015-07-26 LAB — BASIC METABOLIC PANEL
ANION GAP: 7 (ref 5–15)
BUN: 11 mg/dL (ref 6–20)
CALCIUM: 9.3 mg/dL (ref 8.9–10.3)
CO2: 27 mmol/L (ref 22–32)
Chloride: 106 mmol/L (ref 101–111)
Creatinine, Ser: 0.92 mg/dL (ref 0.61–1.24)
GFR calc Af Amer: >60 mL/min (ref >60)
GLUCOSE: 86 mg/dL (ref 65–99)
POTASSIUM: 4.1 mmol/L (ref 3.5–5.1)
SODIUM: 140 mmol/L (ref 135–145)

## 2015-07-26 LAB — LIPID PANEL
CHOL/HDL RATIO: 6.3 ratio
CHOLESTEROL: 233 mg/dL — AB (ref 0–200)
HDL: 37 mg/dL — AB (ref >40)
LDL Cholesterol: 142 mg/dL — ABNORMAL HIGH (ref 0–99)
Triglycerides: 272 mg/dL — ABNORMAL HIGH (ref <150)
VLDL: 54 mg/dL — ABNORMAL HIGH (ref 0–40)

## 2015-07-26 LAB — PROTIME-INR
INR: 1.07 (ref 0.00–1.49)
PROTHROMBIN TIME: 14.1 s (ref 11.6–15.2)

## 2015-07-26 LAB — TROPONIN I

## 2015-07-26 SURGERY — LEFT HEART CATH AND CORONARY ANGIOGRAPHY
Anesthesia: LOCAL

## 2015-07-26 MED ORDER — SODIUM CHLORIDE 0.9 % IJ SOLN: 3.0000 mL | Freq: Two times a day (BID) | INTRAMUSCULAR | Status: DC

## 2015-07-26 MED ORDER — SODIUM CHLORIDE 0.9 % IJ SOLN
3.0000 mL | Freq: Two times a day (BID) | INTRAMUSCULAR | Status: DC
  Administered 2015-07-26: 3 mL via INTRAVENOUS

## 2015-07-26 MED ORDER — NITROGLYCERIN 1 MG/10 ML FOR IR/CATH LAB
INTRA_ARTERIAL | Status: AC
  Filled 2015-07-26: qty 10

## 2015-07-26 MED ORDER — MIDAZOLAM HCL 2 MG/2ML IJ SOLN
INTRAMUSCULAR | Status: DC | PRN
  Administered 2015-07-26: 1 mg via INTRAVENOUS

## 2015-07-26 MED ORDER — LIDOCAINE HCL (PF) 1 % IJ SOLN
INTRAMUSCULAR | Status: AC
  Filled 2015-07-26: qty 30

## 2015-07-26 MED ORDER — ROSUVASTATIN CALCIUM 5 MG PO TABS: 5.0000 mg | ORAL_TABLET | Freq: Every day | ORAL | Status: DC

## 2015-07-26 MED ORDER — SODIUM CHLORIDE 0.9 % WEIGHT BASED INFUSION
3.0000 mL/kg/h | INTRAVENOUS | Status: DC
Start: 2015-07-26 — End: 2015-07-26
  Administered 2015-07-26: 3 mL/kg/h via INTRAVENOUS

## 2015-07-26 MED ORDER — SODIUM CHLORIDE 0.9 % IJ SOLN: 3.0000 mL | INTRAMUSCULAR | Status: DC | PRN

## 2015-07-26 MED ORDER — SODIUM CHLORIDE 0.9 % WEIGHT BASED INFUSION
1.0000 mL/kg/h | INTRAVENOUS | Status: DC
  Administered 2015-07-26: 1 mL/kg/h via INTRAVENOUS

## 2015-07-26 MED ORDER — HEPARIN SODIUM (PORCINE) 1000 UNIT/ML IJ SOLN
INTRAMUSCULAR | Status: DC | PRN
  Administered 2015-07-26: 4000 [IU] via INTRAVENOUS

## 2015-07-26 MED ORDER — ASPIRIN 81 MG PO CHEW
81.0000 mg | CHEWABLE_TABLET | Freq: Every day | ORAL | Status: DC
  Administered 2015-07-26: 81 mg via ORAL
  Filled 2015-07-26: qty 1

## 2015-07-26 MED ORDER — MIDAZOLAM HCL 2 MG/2ML IJ SOLN
INTRAMUSCULAR | Status: AC
  Filled 2015-07-26: qty 4

## 2015-07-26 MED ORDER — HEPARIN (PORCINE) IN NACL 2-0.9 UNIT/ML-% IJ SOLN
INTRAMUSCULAR | Status: AC
  Filled 2015-07-26: qty 1000

## 2015-07-26 MED ORDER — SODIUM CHLORIDE 0.9 % IV SOLN: 250.0000 mL | INTRAVENOUS | Status: DC | PRN

## 2015-07-26 MED ORDER — VERAPAMIL HCL 2.5 MG/ML IV SOLN
INTRA_ARTERIAL | Status: DC | PRN
  Administered 2015-07-26: 15:00:00 via INTRA_ARTERIAL

## 2015-07-26 MED ORDER — HEPARIN SODIUM (PORCINE) 1000 UNIT/ML IJ SOLN
INTRAMUSCULAR | Status: AC
  Filled 2015-07-26: qty 1

## 2015-07-26 MED ORDER — MORPHINE SULFATE (PF) 2 MG/ML IV SOLN: 1.0000 mg | INTRAVENOUS | Status: DC | PRN

## 2015-07-26 MED ORDER — ROSUVASTATIN CALCIUM 10 MG PO TABS
5.0000 mg | ORAL_TABLET | Freq: Every day | ORAL | Status: DC
  Administered 2015-07-26: 5 mg via ORAL
  Filled 2015-07-26: qty 1

## 2015-07-26 MED ORDER — ASPIRIN 81 MG PO CHEW: 81.0000 mg | CHEWABLE_TABLET | ORAL | Status: DC

## 2015-07-26 MED ORDER — SODIUM CHLORIDE 0.9 % WEIGHT BASED INFUSION: 3.0000 mL/kg/h | INTRAVENOUS | Status: DC

## 2015-07-26 MED ORDER — IOHEXOL 350 MG/ML SOLN
INTRAVENOUS | Status: DC | PRN
  Administered 2015-07-26: 80 mL via INTRAVENOUS

## 2015-07-26 MED ORDER — FENTANYL CITRATE (PF) 100 MCG/2ML IJ SOLN
INTRAMUSCULAR | Status: AC
  Filled 2015-07-26: qty 4

## 2015-07-26 MED ORDER — PANTOPRAZOLE SODIUM 40 MG PO TBEC: 40.0000 mg | DELAYED_RELEASE_TABLET | Freq: Two times a day (BID) | ORAL | Status: DC

## 2015-07-26 MED ORDER — VERAPAMIL HCL 2.5 MG/ML IV SOLN
INTRAVENOUS | Status: AC
  Filled 2015-07-26: qty 2

## 2015-07-26 MED ORDER — FENTANYL CITRATE (PF) 100 MCG/2ML IJ SOLN
INTRAMUSCULAR | Status: DC | PRN
  Administered 2015-07-26: 50 ug via INTRAVENOUS

## 2015-07-26 MED ORDER — NITROGLYCERIN 1 MG/10 ML FOR IR/CATH LAB
INTRA_ARTERIAL | Status: DC | PRN
  Administered 2015-07-26: 16:00:00

## 2015-07-26 SURGICAL SUPPLY — 10 items
CATH INFINITI 5FR ANG PIGTAIL (CATHETERS) ×2 IMPLANT
CATH OPTITORQUE TIG 4.0 5F (CATHETERS) ×2 IMPLANT
DEVICE RAD COMP TR BAND LRG (VASCULAR PRODUCTS) ×2 IMPLANT
GLIDESHEATH SLEND A-KIT 6F 22G (SHEATH) ×2 IMPLANT
KIT HEART LEFT (KITS) ×2 IMPLANT
PACK CARDIAC CATHETERIZATION (CUSTOM PROCEDURE TRAY) ×2 IMPLANT
SYR MEDRAD MARK V 150ML (SYRINGE) ×2 IMPLANT
TRANSDUCER W/STOPCOCK (MISCELLANEOUS) ×2 IMPLANT
TUBING CIL FLEX 10 FLL-RA (TUBING) ×2 IMPLANT
WIRE SAFE-T 1.5MM-J .035X260CM (WIRE) ×2 IMPLANT

## 2015-07-26 NOTE — Care Management Obs Status (Signed)
[place] NOTIFICATION   Patient Details  Name: Aaron Lang MRN: [id number] Date of Birth: 05-03-1941   Medicare Observation Status Notification Given:  Yes    Royston Bake, RN 07/26/2015, 1:42 PM

## 2015-07-26 NOTE — Progress Notes (Signed)
Patient was discharged to home accompanied by family members and NT via wheelchair/ Discharge instructions given. Patient verbalized understanding. All personal belongings given. Telemetry  box and IV removed prior to discharge. Denies any pain. No active bleeding to the right radial. Patient's stable. No signs and symptoms of distress noted.

## 2015-07-26 NOTE — Progress Notes (Signed)
Patient Name: Aaron Lang Date of Encounter: 07/26/2015  Primary Cardiologist: Dr. Oval Linsey   Principal Problem:   Chest pain Active Problems:   Hyperlipidemia   GERD (gastroesophageal reflux disease)   Duodenitis   History of Helicobacter pylori infection    SUBJECTIVE  Denies any SOB, continue to have intermittent burning sensation in the chest with radiation to L arm. Radiation to L arm is new. Symptom feels different from usual GERD.    CURRENT MEDS . heparin  5,000 Units Subcutaneous 3 times per day  . multivitamin with minerals  1 tablet Oral Daily  . pantoprazole  40 mg Oral Daily    OBJECTIVE  Filed Vitals:   07/25/15 1651 07/25/15 2100 07/26/15 0029 07/26/15 0502  BP: 138/70 119/68 114/71 127/69  Pulse: 60 64 60 68  Temp: 98.2 F (36.8 C) 98.1 F (36.7 C) 98.2 F (36.8 C) 97.9 F (36.6 C)  TempSrc: Oral Oral Oral Oral  Resp: 16 18 17 17   Height: 5\' 7"  (1.702 m)     Weight: 163 lb 14.4 oz (74.345 kg)   164 lb 8 oz (74.617 kg)  SpO2: 99% 100% 98% 99%    Intake/Output Summary (Last 24 hours) at 07/26/15 0821 Last data filed at 07/26/15 0700  Gross per 24 hour  Intake    360 ml  Output    850 ml  Net   -490 ml   Filed Weights   07/25/15 0646 07/25/15 1651 07/26/15 0502  Weight: 167 lb (75.751 kg) 163 lb 14.4 oz (74.345 kg) 164 lb 8 oz (74.617 kg)    PHYSICAL EXAM  General: Pleasant, NAD. Neuro: Alert and oriented X 3. Moves all extremities spontaneously. Psych: Normal affect. HEENT:  Normal  Neck: Supple without bruits or JVD. Lungs:  Resp regular and unlabored, CTA. Heart: RRR no s3, s4, or murmurs. Abdomen: Soft, non-tender, non-distended, BS + x 4.  Extremities: No clubbing, cyanosis or edema. DP/PT/Radials 2+ and equal bilaterally.  Accessory Clinical Findings  CBC  Recent Labs  07/25/15 0620 07/25/15 0627 07/25/15 1735  WBC 6.2  --  6.6  NEUTROABS  --  1.8  --   HGB 13.8  --  13.7  HCT 40.6  --  40.4  MCV 97.8  --  97.6    PLT 222  --  811   Basic Metabolic Panel  Recent Labs  07/25/15 0620 07/25/15 1735 07/26/15 0227  NA 140  --  140  K 4.1  --  4.1  CL 108  --  106  CO2 23  --  27  GLUCOSE 116*  --  86  BUN 16  --  11  CREATININE 1.00 1.05 0.92  CALCIUM 9.4  --  9.3   Cardiac Enzymes  Recent Labs  07/25/15 1151 07/25/15 1735 07/26/15 0006  TROPONINI <0.03 <0.03 <0.03   Fasting Lipid Panel  Recent Labs  07/26/15 0227  CHOL 233*  HDL 37*  LDLCALC 142*  TRIG 272*  CHOLHDL 6.3    TELE NSR without significant ventricular ectopy    ECG  NSR without significant ST-T wave changes  Radiology/Studies  Dg Chest 2 View  07/25/2015  CLINICAL DATA:  LEFT chest and jaw pain beginning tonight, shortness of breath and nausea. Similar symptoms 1 week ago with spontaneous resolution. History of hyperlipidemia. EXAM: CHEST  2 VIEW COMPARISON:  None. FINDINGS: Cardiomediastinal silhouette is normal. Mild bronchitic changes without pleural effusion or focal consolidation. No pneumothorax. ACDF. Soft tissue planes and  the included osseous structure are nonsuspicious. Partially imaged abdominal aortic calcifications. IMPRESSION: Mild bronchitic changes without focal consolidation. Electronically Signed   By: Elon Alas M.D.   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 Feels different from his usual GERD, unrelieved by pepcid, PPI or GI cocktail  1. Chest pain  - some typical and atypical features, occuring both with exertion and at rest, but given risk factors include uncontrolled HLD, concerning for unstable angina  - serial negative, EKG no ischemic changes overnight  - does have significant h/o h pylori treated in 2014 and duodenitis, tried PPI, IV pepcid and GI cocktail yesterday without obvious  relief. CP continue to be intermittent throughout the entire night  - discussed benefit and risk of both myoview and cardiac cath, patient is interested in definitive assessment  - Risk and benefit of procedure explained to the patient who display clear understanding and agree to proceed. Discussed with patient possible procedural risk include bleeding, vascular injury, renal injury, arrythmia, MI, stroke and loss of limb or life.  - if cath negative for significant blockage, expect discharge later today or tomorrow to followup with GI as outpatient  2. Hyperlipidemia: intolerant to several statin include pravastatin and lovastatin  - Chol 233, Trig 272, HDL 37, LDL 142. Per pt, he came off of statin in Jan due to myalgia  - agreed to try a course of low dose Crestor, if still unable to tolerate, will consider PCSK9 inhibitor  3. GERD 4. H/o duodenitis 5. H. pylori treated with triple therapy in 2014   Signed, Jessalyn Hinojosa [employer] Pager: [phone]

## 2015-07-26 NOTE — Discharge Instructions (Signed)
No driving for 24 hours. No lifting over 5 lbs for 1 week. No sexual activity for 1 week. Keep procedure site clean & dry. If you notice increased pain, swelling, bleeding or pus, call/return!  You may shower, but no soaking baths/hot tubs/pools for 1 week.  ° ° °

## 2015-07-26 NOTE — Discharge Summary (Signed)
Discharge Summary   Patient ID: Aaron Lang,  MRN: [id number], DOB/AGE: [birth year] 73 y.o.  Admit date: 07/25/2015 Discharge date: 07/26/2015  Primary Care Provider: Leonard Downing Primary Cardiologist: new - Dr. Oval Linsey  Discharge Diagnoses Principal Problem:   Angina, class III ([place]) Active Problems:   Hyperlipidemia   Chest pain   GERD (gastroesophageal reflux disease)   Duodenitis   History of Helicobacter pylori infection   Chest Pain at rest concerning for Unstable angina [facility])    Essential hypertension   Allergies No Known Allergies  Procedures  Cardiac catheterization Conclusion    1. Angiographically only mild to moderate disease involving the proximal circumflex and mid LAD. No culprit lesion to explain angina symptoms. 2. The left ventricular systolic function is normal. 3. Normal LVEDP   Given the absence of any significant coronary disease, would consider nonanginal chest pain.  Plan:  Return to nursing care floor for TR band removal per protocol.  WIll defer to primary team, however could consider the possibility of discharged today after bed rest.   Left Heart    Left Ventricle The left ventricular size is normal. The left ventricular systolic function is normal. The left ventricular ejection fraction is 55-65% by visual estimate. There are no wall motion abnormalities in the left ventricle.   Mitral Valve There is trivial (1+) mitral regurgitation.   Aortic Valve There is no aortic valve stenosis.        Hospital Course  The patient is a pleasant 74 year old male with past medical history of hyperlipidemia, GERD, duodenitis, and H. pylori treated with triple therapy in 2014. He has no prior cardiac history, however has significant family history of heart disease as both his parents died of heart disease later in life. He denies any family history of early CAD before age 71. He has led very active lifestyle, however recently he has  noticed some increasing lack of energy. In the past 6 month, he described occasional sensation of palpitation in the middle of the night when he's leaning on his left side, the symptom would resolve when he turned to the right side and would recur again when he leaned on the left side again. He has no prior history of arrhythmia. For the past week, he has noticed a burning sensation in the substernal area radiating to the left shoulder and jaw occurring both at rest and with exertion. The symptom is different from his usual acid reflux sensation or his past experience with duodenitis. Interestingly enough, it seems to occur early in the morning. He continued his active lifestyle, hiding the fact from his wife that sometimes he has chest discomfort doing yard work. He did do a lot of yard work one the day prior to arrival and had an episode during exertion. He denies any obvious exacerbating or alleviating factors.   He woke up around 2AM on 07/25/2015 with same burning sensation, however it did not go away this time. We have attempted to give him Pepcid, PPI, and a GI cocktail in the ED without significant improvement in the symptom. EKG and troponin was negative. He was admitted to cardiology service, overnight he continued to have intermittent chest discomfort. After discussing various options with the patient, he is interested in definitive assessment of coronary artery. Given his description of exertional symptom, it was concerning for unstable angina. He was also noted to have uncontrolled hyperlipidemia with lipid panel obtained on 11/4 showing cholesterol 233, triglycerides 272, HDL 37, LDL 142. He underwent cardiac  catheterization on 07/26/2015 which showed normal LVEDP, trivial MR, EF 55-65%, mild to moderate coronary artery disease involving proximal left circumflex and the mid LAD, however no culprit lesion was found. Post cath, he did well without significant discomfort.   He is deemed stable for  discharge from cardiology perspective after TR band removal. Given his uncontrolled hyperlipidemia, Crestor was added during this hospitalization. He has multiple intolerance to statin in the past with lovastatin and pravastatin due to myalgia. He has not taking any statin since January 2016. Given his uncontrolled hyperlipidemia, we have started him on low-dose Crestor. If he can tolerate this dose, we plan to increase the Crestor on follow-up. If he has side effect, we may ask him to take every other day or consider PCSK9 inhibitor. He has been instructed to follow-up with his primary care physician closely for blood pressure management and hyperlipidemia management. He has also been instructed to contact his GI doctor for further evaluation of chest pain. During the mean time, we will increase his PPI to twice a day dosing. I have arranged outpatient follow-up was Dr. Oval Linsey in one month. He had been instructed not to lift anything > 5 pounds after cath for 1 week.    Discharge Vitals Blood pressure 140/77, pulse 69, temperature 98.1 F (36.7 C), temperature source Oral, resp. rate 11, height 5\' 7"  (1.702 m), weight 164 lb 7.4 oz (74.6 kg), SpO2 98 %.  Filed Weights   07/25/15 1651 07/26/15 0502 07/26/15 1017  Weight: 163 lb 14.4 oz (74.345 kg) 164 lb 8 oz (74.617 kg) 164 lb 7.4 oz (74.6 kg)    Labs  CBC  Recent Labs  07/25/15 0620 07/25/15 0627 07/25/15 1735  WBC 6.2  --  6.6  NEUTROABS  --  1.8  --   HGB 13.8  --  13.7  HCT 40.6  --  40.4  MCV 97.8  --  97.6  PLT 222  --  017   Basic Metabolic Panel  Recent Labs  07/25/15 0620 07/25/15 1735 07/26/15 0227  NA 140  --  140  K 4.1  --  4.1  CL 108  --  106  CO2 23  --  27  GLUCOSE 116*  --  86  BUN 16  --  11  CREATININE 1.00 1.05 0.92  CALCIUM 9.4  --  9.3   Cardiac Enzymes  Recent Labs  07/25/15 1151 07/25/15 1735 07/26/15 0006  TROPONINI <0.03 <0.03 <0.03   Fasting Lipid Panel  Recent Labs  07/26/15 0227   CHOL 233*  HDL 37*  LDLCALC 142*  TRIG 272*  CHOLHDL 6.3   Disposition  Pt is being discharged home today in good condition.  Follow-up Plans & Appointments      Follow-up Information    Follow up with Sharol Harness, MD On 08/19/2015.   Specialty:  Cardiology   Why:  @9 :15am. Cardiology followup   Contact information:   [address] [place] [address] [phone]       Follow up with Leonard Downing, MD.   Specialty:  Family Medicine   Why:  Please followup with your primary care physician regarding blood pressure and cholesterol   Contact information:   [address] [place] [address] [phone]       Please follow up.   Why:  Please followup with your GI doctor for further evaluation to chest pain      Discharge Medications    Medication List  STOP taking these medications        omeprazole 20 MG capsule  Commonly known as:  PRILOSEC     ranitidine 150 MG tablet  Commonly known as:  ZANTAC     sucralfate 1 G tablet  Commonly known as:  CARAFATE      TAKE these medications        aspirin EC 81 MG tablet  Take 324 mg by mouth once.     multivitamin tablet  Take 1 tablet by mouth daily.     pantoprazole 40 MG tablet  Commonly known as:  PROTONIX  Take 1 tablet (40 mg total) by mouth 2 (two) times daily.     rosuvastatin 5 MG tablet  Commonly known as:  CRESTOR  Take 1 tablet (5 mg total) by mouth daily at 6 PM.        Duration of Discharge Encounter   Greater than 30 minutes including physician time.  Hilbert Corrigan PA-C Pager: [phone] 07/26/2015, 4:42 PM

## 2015-07-26 NOTE — H&P (View-Only) (Signed)
Patient Name: Aaron Lang Date of Encounter: 07/26/2015  Primary Cardiologist: Dr. Oval Linsey   Principal Problem:   Chest pain Active Problems:   Hyperlipidemia   GERD (gastroesophageal reflux disease)   Duodenitis   History of Helicobacter pylori infection    SUBJECTIVE  Denies any SOB, continue to have intermittent burning sensation in the chest with radiation to L arm. Radiation to L arm is new. Symptom feels different from usual GERD.    CURRENT MEDS . heparin  5,000 Units Subcutaneous 3 times per day  . multivitamin with minerals  1 tablet Oral Daily  . pantoprazole  40 mg Oral Daily    OBJECTIVE  Filed Vitals:   07/25/15 1651 07/25/15 2100 07/26/15 0029 07/26/15 0502  BP: 138/70 119/68 114/71 127/69  Pulse: 60 64 60 68  Temp: 98.2 F (36.8 C) 98.1 F (36.7 C) 98.2 F (36.8 C) 97.9 F (36.6 C)  TempSrc: Oral Oral Oral Oral  Resp: 16 18 17 17   Height: 5\' 7"  (1.702 m)     Weight: 163 lb 14.4 oz (74.345 kg)   164 lb 8 oz (74.617 kg)  SpO2: 99% 100% 98% 99%    Intake/Output Summary (Last 24 hours) at 07/26/15 0821 Last data filed at 07/26/15 0700  Gross per 24 hour  Intake    360 ml  Output    850 ml  Net   -490 ml   Filed Weights   07/25/15 0646 07/25/15 1651 07/26/15 0502  Weight: 167 lb (75.751 kg) 163 lb 14.4 oz (74.345 kg) 164 lb 8 oz (74.617 kg)    PHYSICAL EXAM  General: Pleasant, NAD. Neuro: Alert and oriented X 3. Moves all extremities spontaneously. Psych: Normal affect. HEENT:  Normal  Neck: Supple without bruits or JVD. Lungs:  Resp regular and unlabored, CTA. Heart: RRR no s3, s4, or murmurs. Abdomen: Soft, non-tender, non-distended, BS + x 4.  Extremities: No clubbing, cyanosis or edema. DP/PT/Radials 2+ and equal bilaterally.  Accessory Clinical Findings  CBC  Recent Labs  07/25/15 0620 07/25/15 0627 07/25/15 1735  WBC 6.2  --  6.6  NEUTROABS  --  1.8  --   HGB 13.8  --  13.7  HCT 40.6  --  40.4  MCV 97.8  --  97.6    PLT 222  --  841   Basic Metabolic Panel  Recent Labs  07/25/15 0620 07/25/15 1735 07/26/15 0227  NA 140  --  140  K 4.1  --  4.1  CL 108  --  106  CO2 23  --  27  GLUCOSE 116*  --  86  BUN 16  --  11  CREATININE 1.00 1.05 0.92  CALCIUM 9.4  --  9.3   Cardiac Enzymes  Recent Labs  07/25/15 1151 07/25/15 1735 07/26/15 0006  TROPONINI <0.03 <0.03 <0.03   Fasting Lipid Panel  Recent Labs  07/26/15 0227  CHOL 233*  HDL 37*  LDLCALC 142*  TRIG 272*  CHOLHDL 6.3    TELE NSR without significant ventricular ectopy    ECG  NSR without significant ST-T wave changes  Radiology/Studies  Dg Chest 2 View  07/25/2015  CLINICAL DATA:  LEFT chest and jaw pain beginning tonight, shortness of breath and nausea. Similar symptoms 1 week ago with spontaneous resolution. History of hyperlipidemia. EXAM: CHEST  2 VIEW COMPARISON:  None. FINDINGS: Cardiomediastinal silhouette is normal. Mild bronchitic changes without pleural effusion or focal consolidation. No pneumothorax. ACDF. Soft tissue planes and  the included osseous structure are nonsuspicious. Partially imaged abdominal aortic calcifications. IMPRESSION: Mild bronchitic changes without focal consolidation. Electronically Signed   By: Elon Alas M.D.   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 Feels different from his usual GERD, unrelieved by pepcid, PPI or GI cocktail  1. Chest pain  - some typical and atypical features, occuring both with exertion and at rest, but given risk factors include uncontrolled HLD, concerning for unstable angina  - serial negative, EKG no ischemic changes overnight  - does have significant h/o h pylori treated in 2014 and duodenitis, tried PPI, IV pepcid and GI cocktail yesterday without obvious  relief. CP continue to be intermittent throughout the entire night  - discussed benefit and risk of both myoview and cardiac cath, patient is interested in definitive assessment  - Risk and benefit of procedure explained to the patient who display clear understanding and agree to proceed. Discussed with patient possible procedural risk include bleeding, vascular injury, renal injury, arrythmia, MI, stroke and loss of limb or life.  - if cath negative for significant blockage, expect discharge later today or tomorrow to followup with GI as outpatient  2. Hyperlipidemia: intolerant to several statin include pravastatin and lovastatin  - Chol 233, Trig 272, HDL 37, LDL 142. Per pt, he came off of statin in Jan due to myalgia  - agreed to try a course of low dose Crestor, if still unable to tolerate, will consider PCSK9 inhibitor  3. GERD 4. H/o duodenitis 5. H. pylori treated with triple therapy in 2014   Signed, Dewitte Vannice [facility] Pager: [phone]

## 2015-07-26 NOTE — Interval H&P Note (Signed)
History and Physical Interval Note:  07/26/2015 2:55 PM  [place]  has presented today for surgery, with the diagnosis of Chest Pain concerning for Class III Angina / Unstable Angina.  The various methods of treatment have been discussed with the patient and family. After consideration of risks, benefits and other options for treatment, the patient has consented to  Procedure(s): Left Heart Cath and Coronary Angiography (N/A) & possible Percutaneous Coronary Intervention  as a surgical intervention .  The patient's history has been reviewed, patient examined, no change in status, stable for surgery.  I have reviewed the patient's chart and labs.  Questions were answered to the patient's satisfaction.    Cath Lab Visit (complete for each Cath Lab visit)  Clinical Evaluation Leading to the Procedure:   ACS: No.  Non-ACS:    Anginal Classification: CCS III  Anti-ischemic medical therapy: No Therapy  Non-Invasive Test Results: No non-invasive testing performed  Prior CABG: No previous CABG   AUC FOR CATH: CAD Assessment (Coronary Angiography With or Without Left Heart Catheterization and/or Left Ventriculography)  Patient Information:    Suspected CAD (No Prior PCI, No Prior CABG, and No Prior Angiogram Showing >= 50% Angiographic Stenosis)   No Prior Noninvasive Testing   Symptomatic   Intermediate Pretest Probability  AUC Score:   U (6)   Indication:   9   AUC FOR PCI: Ischemic Symptoms? CCS III (Marked limitation of ordinary activity) Anti-ischemic Medical Therapy? No Therapy Non-invasive Test Results? No non-invasive testing performed Prior CABG? No Previous CABG   Patient Information:   1-2V CAD, no prox LAD  A (7)  Indication: 20; Score: 7   Patient Information:   1-2V-CAD with DS 50-60% With No FFR, No IVUS  I (3)  Indication: 21; Score: 3   Patient Information:   1-2V-CAD with DS 50-60% With FFR  A (7)  Indication: 22; Score: 7   Patient  Information:   1-2V-CAD with DS 50-60% With FFR>0.8, IVUS not significant  I (2)  Indication: 23; Score: 2   Patient Information:   3V-CAD without LMCA With Abnormal LV systolic function  A (9)  Indication: 48; Score: 9   Patient Information:   LMCA-CAD  A (9)  Indication: 49; Score: 9   Patient Information:   2V-CAD with prox LAD PCI  A (7)  Indication: 62; Score: 7   Patient Information:   2V-CAD with prox LAD CABG  A (8)  Indication: 62; Score: 8   Patient Information:   3V-CAD without LMCA With Low CAD burden(i.e., 3 focal stenoses, low SYNTAX score) PCI  A (7)  Indication: 63; Score: 7   Patient Information:   3V-CAD without LMCA With Low CAD burden(i.e., 3 focal stenoses, low SYNTAX score) CABG  A (9)  Indication: 63; Score: 9   Patient Information:   3V-CAD without LMCA E06c - Intermediate-high CAD burden (i.e., multiple diffuse lesions, presence of CTO, or high SYNTAX score) PCI  U (4)  Indication: 64; Score: 4   Patient Information:   3V-CAD without LMCA E06c - Intermediate-high CAD burden (i.e., multiple diffuse lesions, presence of CTO, or high SYNTAX score) CABG  A (9)  Indication: 64; Score: 9   Patient Information:   LMCA-CAD With Isolated LMCA stenosis  PCI  U (6)  Indication: 65; Score: 6   Patient Information:   LMCA-CAD With Isolated LMCA stenosis  CABG  A (9)  Indication: 65; Score: 9   Patient Information:   LMCA-CAD  Additional CAD, low CAD burden (i.e., 1- to 2-vessel additional involvement, low SYNTAX score) PCI  U (5)  Indication: 66; Score: 5   Patient Information:   LMCA-CAD Additional CAD, low CAD burden (i.e., 1- to 2-vessel additional involvement, low SYNTAX score) CABG  A (9)  Indication: 66; Score: 9   Patient Information:   LMCA-CAD Additional CAD, intermediate-high CAD burden (i.e., 3-vessel involvement, presence of CTO, or high SYNTAX score) PCI  I (3)  Indication: 67;  Score: 3   Patient Information:   LMCA-CAD Additional CAD, intermediate-high CAD burden (i.e., 3-vessel involvement, presence of CTO, or high SYNTAX score) CABG  A (9)  Indication: 67; Score: 9  Marisue Canion W, MD         Ellyn Hack, Mairlyn Tegtmeyer W

## 2015-07-29 ENCOUNTER — Encounter (HOSPITAL_COMMUNITY): Payer: Self-pay | Admitting: Cardiology

## 2015-08-16 NOTE — Progress Notes (Signed)
Cardiology Office Note   Date:  08/19/2015   ID:  Aaron Lang, Aaron Lang 08/13/1941, MRN [id number]  PCP:  Leonard Downing, MD  Cardiologist:   Sharol Harness, MD   Chief Complaint  Patient presents with  . Follow-up    pt states no chest pain no SOB no swelling in legs no light headedness or dizziness      History of Present Illness: Aaron Lang is a 74 y.o. male with hypertension, hyperlipidemia, and non-obstructive CAD who presents for follow up after a recent admission for chest pain.  Aaron Lang was admitted to the hospital from 11/3-11/4 due to exertional, burning chest pain.  Cardiac enzymes were negative and EKG    His symptoms did not improve with a PPI or GI cocktail.  He was unremarkable.  He preferred to undergo cardiac catheterization rather than stress testing.  On cardiac cath he was noted to have mild, non-obstructive coronary disease. He was started on rosuvastatin for hyperlipdemia. Since discharge she has been tolerating 5 mg of rosuvastatin without myalgias. He's had issues with statins at higher potency is in the past. He's not had any recurrent chest discomfort.His primary care physician started him on a PPI and Carafate He denies any shortness of breath, lower extremity edema, orthopnea or PND.  Aaron Lang blood pressure has been labile. He's checked it regularly at home and it typically is in the 120s over 80s However there are times when his systolic blood pressures in the 160s to 170s. In the hospital was fairly well controlled, but it has been elevated with his primary care physician in the past.He's never been on an antihypertensive though he had Aaron Lang have weighed the pros and cons of starting a medication.   Past Medical History  Diagnosis Date  . GERD (gastroesophageal reflux disease)   . Kidney stones   . HLD (hyperlipidemia)   . Allergic rhinitis   . Esophagitis 10/2012  . Duodenitis 10/2012  . Diverticulosis   . Tubular adenoma   . H.  pylori infection     Past Surgical History  Procedure Laterality Date  . Neck surgery    . Tonsillectomy    . Back surgery      stenosis  . Cardiac catheterization N/A 07/26/2015    Procedure: Left Heart Cath and Coronary Angiography;  Surgeon: Aaron Man, MD;  Location: [place] [facility];  Service: Cardiovascular;  Laterality: N/A;     Current Outpatient Prescriptions  Medication Sig Dispense Refill  . co-enzyme Q-10 50 MG capsule Take 50 mg by mouth 2 (two) times daily.    . fluticasone (FLONASE) 50 MCG/ACT nasal spray Place 1 spray into both nostrils as needed for allergies or rhinitis.    . Multiple Vitamin (MULTIVITAMIN) tablet Take 1 tablet by mouth daily.    . pantoprazole (PROTONIX) 40 MG tablet Take 1 tablet (40 mg total) by mouth 2 (two) times daily. 60 tablet 5  . rosuvastatin (CRESTOR) 5 MG tablet Take 1 tablet (5 mg total) by mouth daily at 6 PM. 30 tablet 11  . sucralfate (CARAFATE) 1 G tablet Take 1 tablet by mouth 4 (four) times daily.   98  . lisinopril (PRINIVIL,ZESTRIL) 10 MG tablet Take 1 tablet (10 mg total) by mouth daily. 90 tablet 3   No current facility-administered medications for this visit.    Allergies:   Review of patient's allergies indicates no known allergies.    Social History:  The patient  reports that he has never smoked. He has never used smokeless tobacco. He reports that he drinks alcohol. He reports that he does not use illicit drugs.   Family History:  The patient's family history includes Diabetes in his brother and mother; Heart disease in his father.    ROS:  Please see the history of present illness.   Otherwise, review of systems are positive for none.   All other systems are reviewed and negative.    PHYSICAL EXAM: VS:  BP 192/94 mmHg  Pulse 70  Ht 5\' 7"  (1.702 m)  Wt 75.751 kg (167 lb)  BMI 26.15 kg/m2 , BMI Body mass index is 26.15 kg/(m^2). GENERAL:  Well appearing HEENT:  Pupils equal round and reactive, fundi not  visualized, oral mucosa unremarkable NECK:  No jugular venous distention, waveform within normal limits, carotid upstroke brisk and symmetric, no bruits, no thyromegaly LYMPHATICS:  No cervical adenopathy LUNGS:  Clear to auscultation bilaterally HEART:  RRR.  PMI not displaced or sustained,S1 and S2 within normal limits, no S3, no S4, no clicks, no rubs, no murmurs ABD:  Flat, positive bowel sounds normal in frequency in pitch, no bruits, no rebound, no guarding, no midline pulsatile mass, no hepatomegaly, no splenomegaly EXT:  2 plus pulses throughout, no edema, no cyanosis no clubbing SKIN:  No rashes no nodules NEURO:  Cranial nerves II through XII grossly intact, motor grossly intact throughout PSYCH:  Cognitively intact, oriented to person place and time    EKG:  EKG is not ordered today.  LHC 07/26/15: 1. Angiographically only mild to moderate disease involving the proximal circumflex and mid LAD. No culprit lesion to explain angina symptoms. 2. The left ventricular systolic function is normal. 3. Normal LVEDP    Recent Labs: 07/25/2015: Hemoglobin 13.7; Platelets 237 07/26/2015: BUN 11; Creatinine, Ser 0.92; Potassium 4.1; Sodium 140    Lipid Panel    Component Value Date/Time   CHOL 233* 07/26/2015 0227   TRIG 272* 07/26/2015 0227   HDL 37* 07/26/2015 0227   CHOLHDL 6.3 07/26/2015 0227   VLDL 54* 07/26/2015 0227   LDLCALC 142* 07/26/2015 0227      Wt Readings from Last 3 Encounters:  08/19/15 75.751 kg (167 lb)  07/26/15 74.6 kg (164 lb 7.4 oz)  12/14/12 81.194 kg (179 lb)      ASSESSMENT AND PLAN:  # GERD: Chest pain resolved with PPI and Carafate. Therefore his symptoms were likely related to gastroesophageal reflux disease.  # Hypertension: Blood pressure has been labile. Today it was markedly elevated and he reports that it has been up to the [id number] to [id number] systolic at home.Given that his blood pressure is well controlled at times, we will start a low dose  of lisinopril to avoid hypotension and dizziness. I'm concerned that these spikes in blood pressure may affect his heart and kidneys long-term.  Starting lisinopril 10 mg daily with plans to repeat BMP in 1 week.  # Hyperlipidemia: Aaron Lang started rosuvastatin during his hospitalization.Given that he has been tolerating 500 g without symptoms, he will increase to 10 mg today. He has follow-up scheduled with Aaron Lang in 4 more weeks for reassessment of his lipids and LFTs.  This is not associated with palpitations and she has not had syncope. Aspirin was started during Mr. Hoerig last hospitalization. He's had issues with duodenitis and gastroesophageal reflux disease.Given that it is less clear that this is effective for primary prevention, we will stop aspirin at this time in  order to avoid GI bleed in the future.  Current medicines are reviewed at length with the patient today.  The patient does not have concerns regarding medicines.  The following changes have been made:  no change  Labs/ tests ordered today include:   Orders Placed This Encounter  Procedures  . Basic metabolic panel     Disposition:   FU with Senica Crall C. Oval Linsey, MD in 6 months.    Signed, Sharol Harness, MD  08/19/2015 9:39 AM    [facility]

## 2015-08-19 ENCOUNTER — Encounter: Payer: Self-pay | Admitting: Cardiovascular Disease

## 2015-08-19 ENCOUNTER — Ambulatory Visit (INDEPENDENT_AMBULATORY_CARE_PROVIDER_SITE_OTHER): Payer: Medicare Other | Admitting: Cardiovascular Disease

## 2015-08-19 VITALS — BP 192/94 | HR 70 | Ht 67.0 in | Wt 167.0 lb

## 2015-08-19 DIAGNOSIS — E785 Hyperlipidemia, unspecified: Secondary | ICD-10-CM | POA: Diagnosis not present

## 2015-08-19 DIAGNOSIS — I10 Essential (primary) hypertension: Secondary | ICD-10-CM

## 2015-08-19 DIAGNOSIS — Z5181 Encounter for therapeutic drug level monitoring: Secondary | ICD-10-CM

## 2015-08-19 MED ORDER — LISINOPRIL 10 MG PO TABS: 10.0000 mg | ORAL_TABLET | Freq: Every day | ORAL | Status: DC

## 2015-08-19 NOTE — Patient Instructions (Signed)
Medication Instructions:  STOP Aspirin START Lisinopril 10 mg - take 1 tablet by mouth once daily.  >>A new prescription has been sent to your pharmacy electronically.  Labwork: Your physician recommends that you return for lab work in 1 week.  Testing/Procedures: NONE  Follow-Up: Dr Oval Linsey recommends that you schedule a follow-up appointment in 6 months. You will receive a reminder letter in the mail two months in advance. If you don't receive a letter, please call our office to schedule the follow-up appointment.  If you need a refill on your cardiac medications before your next appointment, please call your pharmacy.

## 2015-10-09 ENCOUNTER — Telehealth: Payer: Self-pay | Admitting: Gastroenterology

## 2015-10-09 ENCOUNTER — Encounter: Payer: Self-pay | Admitting: Gastroenterology

## 2015-10-09 ENCOUNTER — Ambulatory Visit (INDEPENDENT_AMBULATORY_CARE_PROVIDER_SITE_OTHER): Payer: Medicare Other | Admitting: Gastroenterology

## 2015-10-09 VITALS — BP 140/82 | HR 72 | Ht 67.0 in | Wt 160.1 lb

## 2015-10-09 DIAGNOSIS — K219 Gastro-esophageal reflux disease without esophagitis: Secondary | ICD-10-CM | POA: Diagnosis not present

## 2015-10-09 DIAGNOSIS — Z1211 Encounter for screening for malignant neoplasm of colon: Secondary | ICD-10-CM | POA: Diagnosis not present

## 2015-10-09 NOTE — Telephone Encounter (Signed)
I reviewed his path and last colonoscopy report, he is due in 2017 for recall colonoscopy. Can you please schedule it

## 2015-10-09 NOTE — Telephone Encounter (Signed)
Red blood count is low and wants to talk to you personally   hemogloblin 13.5  4.12 on red blood count    MCV 98    hematocrit 40.2      Pain under right arm that radiates to his back is concerned about the low red blood count and weight loss     Call patient at [facility] the patient will be working there tomorrow   Irregular stools No blood     Thinks maybe he may need a colonoscopy now instead of waiting till 2019

## 2015-10-09 NOTE — Patient Instructions (Signed)
Follow up in 1 year.

## 2015-10-09 NOTE — Progress Notes (Signed)
Aaron Lang    [id number]    Feb 16, 1941  Primary Care Physician:ELKINS,WILSON Danne Baxter, MD  Referring Physician: Leonard Downing, MD [address] [place], [place] [address]  Chief complaint: GERD  HPI:  75 year old here for follow-up visit for GERD. He had EGD and colonoscopy in 2014 by Dr. Olevia Perches, gastric biopsies were positive for H. 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 He was treated for H. pylori and has confirmed eradication based on breath test. Since after he stopped taking PPI and was taking Zantac as needed. He was hospitalized in Nov 2016 for chest pain when he had extensive evaluation for possible angina and coronary artery disease and was negative, his symptoms were thought to be due to uncontrolled GERD. After the hospitalization he was restarted on PPI and is currently on omeprazole 20 mg once a day. He reports significant improvement of symptoms after starting PPI and no longer has significant heartburn or regurgitation. Denies any dysphagia or odynophagia.  Outpatient Encounter Prescriptions as of 10/09/2015  Medication Sig  . co-enzyme Q-10 50 MG capsule Take 50 mg by mouth 2 (two) times daily.  . fluticasone (FLONASE) 50 MCG/ACT nasal spray Place 1 spray into both nostrils as needed for allergies or rhinitis.  Marland Kitchen losartan (COZAAR) 50 MG tablet Take 50 mg by mouth daily.  . Multiple Vitamin (MULTIVITAMIN) tablet Take 1 tablet by mouth daily.  Marland Kitchen omeprazole (PRILOSEC) 20 MG capsule Take 1 capsule by mouth daily.  . rosuvastatin (CRESTOR) 10 MG tablet Take 10 mg by mouth daily.  . sucralfate (CARAFATE) 1 G tablet Take 1 tablet by mouth 4 (four) times daily.   . [DISCONTINUED] lisinopril (PRINIVIL,ZESTRIL) 10 MG tablet Take 1 tablet (10 mg total) by mouth daily.  . [DISCONTINUED] pantoprazole (PROTONIX) 40 MG tablet Take 1 tablet (40 mg total) by mouth 2 (two) times daily.  . [DISCONTINUED] rosuvastatin (CRESTOR) 5 MG tablet Take  1 tablet (5 mg total) by mouth daily at 6 PM.   No facility-administered encounter medications on file as of 10/09/2015.    Allergies as of 10/09/2015  . (No Known Allergies)    Past Medical History  Diagnosis Date  . GERD (gastroesophageal reflux disease)   . Kidney stones   . HLD (hyperlipidemia)   . Allergic rhinitis   . Esophagitis 10/2012  . Duodenitis 10/2012  . Diverticulosis   . Tubular adenoma   . H. pylori infection     Past Surgical History  Procedure Laterality Date  . Neck surgery    . Tonsillectomy    . Back surgery      stenosis  . Cardiac catheterization N/A 07/26/2015    Procedure: Left Heart Cath and Coronary Angiography;  Surgeon: [name], MD;  Location: [facility];  Service: Cardiovascular;  Laterality: N/A;    Family History  Problem Relation Age of Onset  . Diabetes Mother   . Diabetes Brother   . Heart disease Father     Social History   Social History  . Marital Status: Married    Spouse Name: N/A  . Number of Children: 1  . Years of Education: N/A   Occupational History  . pharmacist    Social History Main Topics  . Smoking status: Never Smoker   . Smokeless tobacco: Never Used  . Alcohol Use: Yes     Comment: less than 1 per day  . Drug Use:  No  . Sexual Activity: Not on file   Other Topics Concern  . Not on file   Social History Narrative      Review of systems: Review of Systems  Constitutional: Negative for fever and chills.  HENT: Negative.   Eyes: Negative for blurred vision.  Respiratory: Negative for cough, shortness of breath and wheezing.   Cardiovascular: Negative for chest pain and palpitations.  Gastrointestinal: as per HPI Genitourinary: Negative for dysuria, urgency, frequency and hematuria.  Musculoskeletal: Negative for myalgias, back pain and joint pain.  Skin: Negative for itching and rash.  Neurological: Negative for dizziness, tremors, focal weakness, seizures and loss of  consciousness.  Endo/Heme/Allergies: Negative for environmental allergies.  Psychiatric/Behavioral: Negative for depression, suicidal ideas and hallucinations.  All other systems reviewed and are negative.   Physical Exam: Filed Vitals:   10/09/15 0852  BP: 140/82  Pulse: 72   Gen:      No acute distress HEENT:  EOMI, sclera anicteric Neck:     No masses; no thyromegaly Lungs:    Clear to auscultation bilaterally; normal respiratory effort CV:         Regular rate and rhythm; no murmurs Abd:      + bowel sounds; soft, non-tender; no palpable masses, no distension Ext:    No edema; adequate peripheral perfusion Skin:      Warm and dry; no rash Neuro: alert and oriented x 3 Psych: normal mood and affect  Data Reviewed:  As per HPI   Assessment and Plan/Recommendations: 88 yr M with h/o GERD here for follow up visit Continue PPI once daily Discussed antireflux measures Due for surveillance colonoscopy in 2017 Return in 12 months  K. Denzil Magnuson , MD [phone] Mon-Fri 8a-5p [phone] after 5p, weekends, holidays

## 2015-10-10 NOTE — Telephone Encounter (Signed)
Contacted patient he is scheduled for a Previsit on 11/01/2015 at 1:30pm  Scheduled for Colonoscopy on 11/14/2015 at 2:30pm    Patient aware of all date and times

## 2015-10-10 NOTE — Telephone Encounter (Signed)
Patient is at work will call me later to schedule a colonoscopy and a previsit

## 2015-10-16 ENCOUNTER — Encounter: Payer: Self-pay | Admitting: Gastroenterology

## 2015-10-28 ENCOUNTER — Ambulatory Visit (AMBULATORY_SURGERY_CENTER): Payer: Self-pay

## 2015-10-28 ENCOUNTER — Telehealth: Payer: Self-pay

## 2015-10-28 VITALS — Ht 68.0 in | Wt 161.6 lb

## 2015-10-28 DIAGNOSIS — Z860100 Personal history of colon polyps, unspecified: Secondary | ICD-10-CM

## 2015-10-28 MED ORDER — SUPREP BOWEL PREP KIT 17.5-3.13-1.6 GM/177ML PO SOLN: 1.0000 | Freq: Once | ORAL | Status: DC

## 2015-10-28 NOTE — Telephone Encounter (Signed)
Dr. Silverio Decamp,     Pt would like EGD as well, but he is not sure he would like to lose this appt for just colonoscopy.  If he could please have both procedures in this spot he would be most appreciative.  He stated if it's not possible he would just proceed as planned with just a colonoscopy.  Thank you, Levada Dy

## 2015-10-28 NOTE — Progress Notes (Signed)
No allergies to eggs or soy No past problems with anesthesia No diet/weight loss meds No home oxygen  Has email and internet; refused emmi 

## 2015-10-28 NOTE — Telephone Encounter (Signed)
Please advise him to keep the colonoscopy appt, No indication for EGD at this point but if he has any concerns, can discuss when he comes in for colonoscopy. Thank you

## 2015-11-14 ENCOUNTER — Encounter: Payer: Self-pay | Admitting: Gastroenterology

## 2015-11-14 ENCOUNTER — Ambulatory Visit (AMBULATORY_SURGERY_CENTER): Payer: Medicare Other | Admitting: Gastroenterology

## 2015-11-14 VITALS — BP 125/66 | HR 67 | Temp 99.8°F | Resp 9 | Ht 68.0 in | Wt 161.0 lb

## 2015-11-14 DIAGNOSIS — D120 Benign neoplasm of cecum: Secondary | ICD-10-CM | POA: Diagnosis not present

## 2015-11-14 DIAGNOSIS — Z86010 Personal history of colonic polyps: Secondary | ICD-10-CM | POA: Diagnosis present

## 2015-11-14 DIAGNOSIS — D122 Benign neoplasm of ascending colon: Secondary | ICD-10-CM | POA: Diagnosis not present

## 2015-11-14 DIAGNOSIS — D123 Benign neoplasm of transverse colon: Secondary | ICD-10-CM | POA: Diagnosis not present

## 2015-11-14 DIAGNOSIS — D121 Benign neoplasm of appendix: Secondary | ICD-10-CM

## 2015-11-14 MED ORDER — SODIUM CHLORIDE 0.9 % IV SOLN: 500.0000 mL | INTRAVENOUS | Status: DC

## 2015-11-14 NOTE — Patient Instructions (Addendum)
FOLLOW DISCHARGE INSTRUCTIONS (BLUE AND GREEN SHEETS).YOU HAD AN ENDOSCOPIC PROCEDURE TODAY AT [facility]:   Refer to the procedure report that was given to you for any specific questions about what was found during the examination.  If the procedure report does not answer your questions, please call your gastroenterologist to clarify.  If you requested that your care partner not be given the details of your procedure findings, then the procedure report has been included in a sealed envelope for you to review at your convenience later.  YOU SHOULD EXPECT: Some feelings of bloating in the abdomen. Passage of more gas than usual.  Walking can help get rid of the air that was put into your GI tract during the procedure and reduce the bloating. If you had a lower endoscopy (such as a colonoscopy or flexible sigmoidoscopy) you may notice spotting of blood in your stool or on the toilet paper. If you underwent a bowel prep for your procedure, you may not have a normal bowel movement for a few days.  Please Note:  You might notice some irritation and congestion in your nose or some drainage.  This is from the oxygen used during your procedure.  There is no need for concern and it should clear up in a day or so.  SYMPTOMS TO REPORT IMMEDIATELY:   Following lower endoscopy (colonoscopy or flexible sigmoidoscopy):  Excessive amounts of blood in the stool  Significant tenderness or worsening of abdominal pains  Swelling of the abdomen that is new, acute  Fever of 100F or higher    For urgent or emergent issues, a gastroenterologist can be reached at any hour by calling [phone].   DIET: Your first meal following the procedure should be a small meal and then it is ok to progress to your normal diet. Heavy or fried foods are harder to digest and may make you feel nauseous or bloated.  Likewise, meals heavy in dairy and vegetables can increase bloating.  Drink plenty of fluids but you  should avoid alcoholic beverages for 24 hours.  ACTIVITY:  You should plan to take it easy for the rest of today and you should NOT DRIVE or use heavy machinery until tomorrow (because of the sedation medicines used during the test).    FOLLOW UP: Our staff will call the number listed on your records the next business day following your procedure to check on you and address any questions or concerns that you may have regarding the information given to you following your procedure. If we do not reach you, we will leave a message.  However, if you are feeling well and you are not experiencing any problems, there is no need to return our call.  We will assume that you have returned to your regular daily activities without incident.  If any biopsies were taken you will be contacted by phone or by letter within the next 1-3 weeks.  Please call us at [phone] if you have not heard about the biopsies in 3 weeks.    SIGNATURES/CONFIDENTIALITY: You and/or your care partner have signed paperwork which will be entered into your electronic medical record.  These signatures attest to the fact that that the information above on your After Visit Summary has been reviewed and is understood.  Full responsibility of the confidentiality of this discharge information lies with you and/or your care-partner.  Diverticulosis, high fiber diet, polyp and hemorrhoid information given.  Avoid all NSAIDS for 2 weeks.

## 2015-11-14 NOTE — Progress Notes (Signed)
  [facility] Post-op Note  Patient: Aaron Lang  Procedure(s) Performed: colonoscopy  Patient Location: [facility] - Recovery Area  Anesthesia Type: Deep Sedation/Propofol  Level of Consciousness: awake, oriented and patient cooperative  Airway and Oxygen Therapy: Patient Spontanous Breathing  Post-op Pain: none  Post-op Assessment:  Post-op Vital signs reviewed, Patient's Cardiovascular Status Stable, Respiratory Function Stable, Patent Airway, No signs of Nausea or vomiting and Pain level controlled  Post-op Vital Signs: Reviewed and stable  Complications: No apparent anesthesia complications  Alaycia Eardley E 2:46 PM

## 2015-11-14 NOTE — Progress Notes (Signed)
Called to room to assist during endoscopic procedure.  Patient ID and intended procedure confirmed with present staff. Received instructions for my participation in the procedure from the performing physician.  

## 2015-11-14 NOTE — Op Note (Signed)
[facility]  [address] [place], [address]   COLONOSCOPY PROCEDURE REPORT  PATIENT: Aaron Lang, Aaron Lang  MR#: [id number] BIRTHDATE: 09-07-1941 , 75  yrs. old GENDER: male ENDOSCOPIST: Harl Bowie, MD REFERRED [id number] Arelia Sneddon, M.D. PROCEDURE DATE:  11/14/2015 PROCEDURE:   Colonoscopy, screening, Colonoscopy with cold biopsy polypectomy, and Colonoscopy with snare polypectomy First Screening Colonoscopy - Avg.  risk and is 50 yrs.  old or older - No.  Prior Negative Screening - Now for repeat screening. 10 or more years since last screening  History of Adenoma - Now for follow-up colonoscopy & has been > or = to 3 yrs.  N/A  Polyps removed today? Yes ASA CLASS:   Class II INDICATIONS:Screening for colonic neoplasia and Colorectal Neoplasm Risk Assessment for this procedure is average risk. MEDICATIONS: Propofol 200 mg IV  DESCRIPTION OF PROCEDURE:   After the risks benefits and alternatives of the procedure were thoroughly explained, informed consent was obtained.  The digital rectal exam revealed no abnormalities of the rectum.   The LB [id number]  endoscope was introduced through the anus and advanced to the terminal ileum which was intubated for a short distance. No adverse events experienced.   The quality of the prep was good.  The instrument was then slowly withdrawn as the colon was fully examined. Estimated blood loss is zero unless otherwise noted in this procedure report.   COLON FINDINGS: The examined terminal ileum appeared to be normal. A sessile polyp ranging between 3-60mm in size was found at the appendiceal orifice.  A polypectomy was performed with cold forceps.  The resection was complete, the polyp tissue was completely retrieved and sent to histology.   Four sessile polyps ranging between 3-22mm in size were found at the hepatic flexure, in the ascending colon, and at the cecum.  A polypectomy was performed with a cold snare.  The  resection was complete, the polyp tissue was completely retrieved and sent to histology.   There was moderate diverticulosis noted in the sigmoid colon and descending colon.   Small internal hemorrhoids were found.  Retroflexed views revealed internal hemorrhoids. The time to cecum = 4.4 Withdrawal time = 12.7   The scope was withdrawn and the procedure completed. COMPLICATIONS: There were no immediate complications.  ENDOSCOPIC IMPRESSION: 1.   The examined terminal ileum appeared to be normal 2.   Sessile polyp ranging between 3-83mm in size was found at the appendiceal orifice; polypectomy was performed with cold forceps 3.   Four sessile polyps ranging between 3-15mm in size were found at the hepatic flexure, in the ascending colon, and at the cecum; polypectomy was performed with a cold snare 4.   Moderate diverticulosis was noted in the sigmoid colon and descending colon 5.   Small internal hemorrhoids  RECOMMENDATIONS: 1.  Await pathology results 2.  Avoid all NSAIDS for the next 2 weeks. 3.  If the polyp(s) removed today are proven to be adenomatous (pre-cancerous) polyps, you will need a colonoscopy in 3 years. Otherwise you should continue to follow colorectal cancer screening guidelines for "routine risk" patients with a colonoscopy in 10 years.  You will receive a letter within 1-2 weeks with the results of your biopsy as well as final recommendations.  Please call my office if you have not received a letter after 3 weeks.  eSigned:  Harl Bowie, MD 11/14/2015 2:49 PM      PATIENT NAME:  Aaron Lang, Aaron Lang MR#: [id number]

## 2015-11-15 ENCOUNTER — Telehealth: Payer: Self-pay

## 2015-11-15 NOTE — Telephone Encounter (Signed)
  Follow up Call-  Call back number 11/14/2015  Post procedure Call Back phone  # [phone]  Permission to leave phone message Yes     Patient questions:  Do you have a fever, pain , or abdominal swelling? No. Pain Score  0 *  Have you tolerated food without any problems? Yes.    Have you been able to return to your normal activities? Yes.    Do you have any questions about your discharge instructions: Diet   No. Medications  No. Follow up visit  No.  Do you have questions or concerns about your Care? No.  Actions: * If pain score is 4 or above: No action needed, pain <4.

## 2015-11-19 ENCOUNTER — Encounter: Payer: Self-pay | Admitting: Gastroenterology

## 2018-02-19 DEATH — deceased

## 2018-11-10 ENCOUNTER — Encounter: Payer: Self-pay | Admitting: Gastroenterology

## 2018-11-21 ENCOUNTER — Encounter: Payer: Self-pay | Admitting: Gastroenterology

## 2018-12-21 ENCOUNTER — Telehealth: Payer: Self-pay

## 2018-12-21 NOTE — Telephone Encounter (Signed)
In error

## 2019-01-02 ENCOUNTER — Encounter: Payer: Medicare Other | Admitting: Gastroenterology

## 2019-02-06 ENCOUNTER — Other Ambulatory Visit: Payer: Self-pay

## 2019-02-06 ENCOUNTER — Ambulatory Visit: Payer: Medicare Other

## 2019-02-06 VITALS — Ht 67.5 in | Wt 160.0 lb

## 2019-02-06 DIAGNOSIS — Z86010 Personal history of colonic polyps: Secondary | ICD-10-CM

## 2019-02-06 MED ORDER — NA SULFATE-K SULFATE-MG SULF 17.5-3.13-1.6 GM/177ML PO SOLN: 1.0000 | Freq: Once | ORAL | 0 refills | Status: AC

## 2019-02-06 NOTE — Progress Notes (Signed)
Per pt, no allergies to soy or egg products.Pt not taking any weight loss meds or using  O2 at home. Pt denies any sedation problems.  Pt refused emmi instructions.  The PV was done over the phone due to COVID-19. I verified the pt's address and insurance. I reviewed the prep instruction and medical history with the pt and will mail the instructions to the pt today. The pt was informed to call our office with any questions or changes prior to his procedure. He understood.

## 2019-02-24 ENCOUNTER — Telehealth: Payer: Self-pay | Admitting: Gastroenterology

## 2019-02-24 NOTE — Telephone Encounter (Signed)
Changed medications per pt's request. And added Losartan as an allergy per pt. All questions answered from patient at this time. Encouraged patient to call us back as needed.

## 2019-02-24 NOTE — Telephone Encounter (Signed)
Returned patient's call-no answer, left a message.

## 2019-02-24 NOTE — Telephone Encounter (Signed)
Left his cell phone in the car. Very sorry, Please call again [phone]. OR home phone # [phone].

## 2019-02-24 NOTE — Telephone Encounter (Signed)
Patient called back wanting to speak with the nurse before his upcoming colon. He stated that he has additional question he would like to ask. It is in regards to a change in his medication.

## 2019-02-27 ENCOUNTER — Telehealth: Payer: Self-pay | Admitting: *Deleted

## 2019-02-27 NOTE — Telephone Encounter (Signed)

## 2019-02-28 ENCOUNTER — Other Ambulatory Visit: Payer: Self-pay

## 2019-02-28 ENCOUNTER — Ambulatory Visit (AMBULATORY_SURGERY_CENTER): Payer: Medicare Other | Admitting: Gastroenterology

## 2019-02-28 ENCOUNTER — Encounter: Payer: Self-pay | Admitting: Gastroenterology

## 2019-02-28 VITALS — BP 132/72 | HR 59 | Temp 98.4°F | Resp 12 | Ht 67.0 in | Wt 160.0 lb

## 2019-02-28 DIAGNOSIS — D122 Benign neoplasm of ascending colon: Secondary | ICD-10-CM

## 2019-02-28 DIAGNOSIS — D123 Benign neoplasm of transverse colon: Secondary | ICD-10-CM | POA: Diagnosis not present

## 2019-02-28 DIAGNOSIS — D120 Benign neoplasm of cecum: Secondary | ICD-10-CM | POA: Diagnosis not present

## 2019-02-28 DIAGNOSIS — Z86010 Personal history of colonic polyps: Secondary | ICD-10-CM | POA: Diagnosis present

## 2019-02-28 DIAGNOSIS — Z860101 Personal history of adenomatous and serrated colon polyps: Secondary | ICD-10-CM

## 2019-02-28 MED ORDER — SODIUM CHLORIDE 0.9 % IV SOLN: 500.0000 mL | Freq: Once | INTRAVENOUS | Status: DC

## 2019-02-28 NOTE — Patient Instructions (Signed)
Read all of the handouts given to you by your recovery room nurse.  YOU HAD AN ENDOSCOPIC PROCEDURE TODAY AT [facility]:   Refer to the procedure report that was given to you for any specific questions about what was found during the examination.  If the procedure report does not answer your questions, please call your gastroenterologist to clarify.  If you requested that your care partner not be given the details of your procedure findings, then the procedure report has been included in a sealed envelope for you to review at your convenience later.  YOU SHOULD EXPECT: Some feelings of bloating in the abdomen. Passage of more gas than usual.  Walking can help get rid of the air that was put into your GI tract during the procedure and reduce the bloating. If you had a lower endoscopy (such as a colonoscopy or flexible sigmoidoscopy) you may notice spotting of blood in your stool or on the toilet paper. If you underwent a bowel prep for your procedure, you may not have a normal bowel movement for a few days.  Please Note:  You might notice some irritation and congestion in your nose or some drainage.  This is from the oxygen used during your procedure.  There is no need for concern and it should clear up in a day or so.  SYMPTOMS TO REPORT IMMEDIATELY:   Following lower endoscopy (colonoscopy or flexible sigmoidoscopy):  Excessive amounts of blood in the stool  Significant tenderness or worsening of abdominal pains  Swelling of the abdomen that is new, acute  Fever of 100F or higher   For urgent or emergent issues, a gastroenterologist can be reached at any hour by calling [phone].   DIET:  We do recommend a small meal at first, but then you may proceed to your regular diet.  Drink plenty of fluids but you should avoid alcoholic beverages for 24 hours.  Try to increase the fiber I  Your diet, and drink plenty of water.  ACTIVITY:  You should plan to take it easy for the  rest of today and you should NOT DRIVE or use heavy machinery until tomorrow (because of the sedation medicines used during the test).    FOLLOW UP: Our staff will call the number listed on your records 48-72 hours following your procedure to check on you and address any questions or concerns that you may have regarding the information given to you following your procedure. If we do not reach you, we will leave a message.  We will attempt to reach you two times.  During this call, we will ask if you have developed any symptoms of COVID 19. If you develop any symptoms (ie: fever, flu-like symptoms, shortness of breath, cough etc.) before then, please call [phone].  If you test positive for Covid 19 in the 2 weeks post procedure, please call and report this information to [place].    If any biopsies were taken you will be contacted by phone or by letter within the next 1-3 weeks.  Please call us at [phone] if you have not heard about the biopsies in 3 weeks.    SIGNATURES/CONFIDENTIALITY: You and/or your care partner have signed paperwork which will be entered into your electronic medical record.  These signatures attest to the fact that that the information above on your After Visit Summary has been reviewed and is understood.  Full responsibility of the confidentiality of this discharge information lies with you and/or  your care-partner. 

## 2019-02-28 NOTE — Progress Notes (Signed)
Report to PACU, RN, vss, BBS= Clear.  

## 2019-02-28 NOTE — Op Note (Signed)
[facility] Patient Name: Aaron Lang Procedure Date: 02/28/2019 8:37 AM MRN: [id number] Endoscopist: Mauri Pole , MD Age: 78 Referring MD:  Date of Birth: Feb 05, 1941 Gender: Male Account #: [id number] Procedure:                Colonoscopy Indications:              High risk colon cancer surveillance: Personal                            history of colonic polyps, Surveillance: Personal                            history of adenomatous polyps on last colonoscopy 3                            years ago, High risk colon cancer surveillance:                            Personal history of multiple (3 or more) adenomas Medicines:                Monitored Anesthesia Care Procedure:                Pre-Anesthesia Assessment:                           - Prior to the procedure, a History and Physical                            was performed, and patient medications and                            allergies were reviewed. The patient's tolerance of                            previous anesthesia was also reviewed. The risks                            and benefits of the procedure and the sedation                            options and risks were discussed with the patient.                            All questions were answered, and informed consent                            was obtained. Prior Anticoagulants: The patient has                            taken no previous anticoagulant or antiplatelet                            agents. ASA Grade Assessment: II - A patient with  mild systemic disease. After reviewing the risks                            and benefits, the patient was deemed in                            satisfactory condition to undergo the procedure.                           After obtaining informed consent, the colonoscope                            was passed under direct vision. Throughout the                            procedure, the  patient's blood pressure, pulse, and                            oxygen saturations were monitored continuously. The                            Colonoscope was introduced through the anus and                            advanced to the the cecum, identified by                            appendiceal orifice and ileocecal valve. The                            colonoscopy was performed without difficulty. The                            patient tolerated the procedure well. The quality                            of the bowel preparation was good. The ileocecal                            valve, appendiceal orifice, and rectum were                            photographed. Scope In: 8:42:17 AM Scope Out: 9:02:02 AM Scope Withdrawal Time: 0 hours 14 minutes 28 seconds  Total Procedure Duration: 0 hours 19 minutes 45 seconds  Findings:                 The perianal and digital rectal examinations were                            normal.                           Four sessile polyps were found in the transverse  colon and cecum. The polyps were 4 to 9 mm in size.                            These polyps were removed with a cold snare.                            Resection and retrieval were complete.                           A 2 mm polyp was found in the ascending colon. The                            polyp was sessile. The polyp was removed with a                            cold biopsy forceps. Resection and retrieval were                            complete.                           Multiple small and large-mouthed diverticula were                            found in the sigmoid colon and descending colon.                            There was evidence of an impacted diverticulum.                           Non-bleeding internal hemorrhoids were found during                            retroflexion. The hemorrhoids were medium-sized. Complications:            No immediate  complications. Estimated Blood Loss:     Estimated blood loss was minimal. Impression:               - Four 4 to 9 mm polyps in the transverse colon and                            in the cecum, removed with a cold snare. Resected                            and retrieved.                           - One 2 mm polyp in the ascending colon, removed                            with a cold biopsy forceps. Resected and retrieved.                           - Moderate diverticulosis in  the sigmoid colon and                            in the descending colon. There was evidence of an                            impacted diverticulum.                           - Non-bleeding internal hemorrhoids. Recommendation:           - Patient has a contact number available for                            emergencies. The signs and symptoms of potential                            delayed complications were discussed with the                            patient. Return to normal activities tomorrow.                            Written discharge instructions were provided to the                            patient.                           - Resume previous diet.                           - Continue present medications.                           - Await pathology results.                           - Repeat colonoscopy in 3 years for surveillance                            based on pathology results. Mauri Pole, MD 02/28/2019 9:11:51 AM This report has been signed electronically.

## 2019-02-28 NOTE — Progress Notes (Signed)
Called to room to assist during endoscopic procedure.  Patient ID and intended procedure confirmed with present staff. Received instructions for my participation in the procedure from the performing physician.  

## 2019-03-02 ENCOUNTER — Telehealth: Payer: Self-pay | Admitting: *Deleted

## 2019-03-02 ENCOUNTER — Telehealth: Payer: Self-pay

## 2019-03-02 NOTE — Telephone Encounter (Signed)
  Follow up Call-  Call back number 02/28/2019  Post procedure Call Back phone  # [phone]  Permission to leave phone message Yes  Some recent data might be hidden     Patient questions:  Do you have a fever, pain , or abdominal swelling? No. Pain Score  0 *  Have you tolerated food without any problems? Yes.    Have you been able to return to your normal activities? Yes.    Do you have any questions about your discharge instructions: Diet   No. Medications  No. Follow up visit  No.  Do you have questions or concerns about your Care? No.  Actions: * If pain score is 4 or above: No action needed, pain <4.  1. Have you developed a fever since your procedure? no  2.   Have you had an respiratory symptoms (SOB or cough) since your procedure? no  3.   Have you tested positive for COVID 19 since your procedure no  4.   Have you had any family members/close contacts diagnosed with the COVID 19 since your procedure?  no   If yes to any of these questions please route to Joylene John, RN and Alphonsa Gin, Therapist, sports.

## 2019-03-02 NOTE — Telephone Encounter (Signed)
Unable to leave message on f/u call, mailbox full °

## 2019-03-03 ENCOUNTER — Encounter: Payer: Self-pay | Admitting: Gastroenterology

## 2019-03-06 ENCOUNTER — Telehealth: Payer: Self-pay

## 2019-03-06 NOTE — Telephone Encounter (Signed)
  Follow up Call-  Call back number 02/28/2019  Post procedure Call Back phone  # [phone]  Permission to leave phone message Yes  Some recent data might be hidden     Patient questions:  Do you have a fever, pain , or abdominal swelling? No. Pain Score  0 *  Have you tolerated food without any problems? Yes.    Have you been able to return to your normal activities? Yes.    Do you have any questions about your discharge instructions: Diet   No. Medications  No. Follow up visit  No.  Do you have questions or concerns about your Care? No.  Actions: * If pain score is 4 or above: No action needed, pain <4. 1. Have you developed a fever since your procedure? no  2.   Have you had an respiratory symptoms (SOB or cough) since your procedure? no  3.   Have you tested positive for COVID 19 since your procedure no  4.   Have you had any family members/close contacts diagnosed with the COVID 19 since your procedure?  no   If yes to any of these questions please route to Joylene John, RN and Alphonsa Gin, Therapist, sports.

## 2019-03-06 NOTE — Telephone Encounter (Signed)
No answer, unable to leave a message, B.Nakyah Erdmann RN. 

## 2022-04-17 ENCOUNTER — Encounter: Payer: Self-pay | Admitting: Gastroenterology

## 2022-07-13 ENCOUNTER — Other Ambulatory Visit (HOSPITAL_COMMUNITY): Payer: Self-pay | Admitting: Family Medicine

## 2022-07-13 DIAGNOSIS — I739 Peripheral vascular disease, unspecified: Secondary | ICD-10-CM

## 2022-07-14 ENCOUNTER — Ambulatory Visit (HOSPITAL_COMMUNITY)
Admission: RE | Admit: 2022-07-14 | Discharge: 2022-07-14 | Disposition: A | Discharge status: Home / Self Care | Payer: Medicare Other | Source: Ambulatory Visit | Attending: Vascular Surgery | Admitting: Vascular Surgery

## 2022-07-14 DIAGNOSIS — I739 Peripheral vascular disease, unspecified: Secondary | ICD-10-CM | POA: Diagnosis not present

## 2023-02-03 ENCOUNTER — Ambulatory Visit (HOSPITAL_COMMUNITY)
Admission: RE | Admit: 2023-02-03 | Discharge: 2023-02-03 | Disposition: A | Discharge status: Home / Self Care | Payer: Medicare Other | Source: Ambulatory Visit | Attending: Cardiovascular Disease | Admitting: Cardiovascular Disease

## 2023-02-03 ENCOUNTER — Other Ambulatory Visit (HOSPITAL_COMMUNITY): Payer: Self-pay | Admitting: Medical

## 2023-02-03 DIAGNOSIS — M79605 Pain in left leg: Secondary | ICD-10-CM | POA: Insufficient documentation

## 2023-02-03 DIAGNOSIS — M79662 Pain in left lower leg: Secondary | ICD-10-CM | POA: Insufficient documentation

## 2023-12-29 LAB — LAB REPORT - SCANNED: EGFR: 69

## 2024-03-26 ENCOUNTER — Ambulatory Visit
Admission: RE | Admit: 2024-03-26 | Discharge: 2024-03-26 | Disposition: A | Discharge status: Home / Self Care | Source: Ambulatory Visit | Attending: Family Medicine | Admitting: Family Medicine

## 2024-03-26 VITALS — BP 156/77 | HR 65 | Temp 98.0°F | Resp 16

## 2024-03-26 DIAGNOSIS — S61412A Laceration without foreign body of left hand, initial encounter: Secondary | ICD-10-CM | POA: Diagnosis not present

## 2024-03-26 DIAGNOSIS — M79642 Pain in left hand: Secondary | ICD-10-CM

## 2024-03-26 MED ORDER — CEPHALEXIN 500 MG PO CAPS: 500.0000 mg | ORAL_CAPSULE | Freq: Three times a day (TID) | ORAL | 0 refills | Status: DC

## 2024-03-26 NOTE — Discharge Instructions (Addendum)
 WOUND CARE Please return in 10 days to have your stitches/staples removed or sooner if you have concerns.  Remove the bandage tonight before bed. Continue changing dressings the next 2-3 days until the wound scabs over well. After cleaning your wound each time allow for some air time before applying another dressing.   Continue daily cleansing with soap and water until stitches/staples are removed.  Do not apply any ointments or creams to the wound while stitches/staples are in place, as this may cause delayed healing.  Notify the office if you experience any of the following signs of infection: Swelling, redness, pus drainage, streaking, fever >101.0 F  Notify the office if you experience excessive bleeding that does not stop after 15-20 minutes of constant, firm pressure.

## 2024-03-26 NOTE — ED Triage Notes (Signed)
 Pt cut left palm while sharpening a kitchen knife 5pm yesterday-NAD-steady gait

## 2024-03-26 NOTE — ED Provider Notes (Signed)
 [facility]  Note:  This document was prepared using Conservation officer, historic buildings and may include unintentional dictation errors.  MRN: [id number] DOB: 04/05/1941  Subjective:   Aaron Lang is a 83 y.o. male presenting for suffering a left palm laceration while sharpening a knife at home.  Has kept the wound clean and covered.  The wound is approximately 16 hours old.  Tdap is up-to-date.  No current facility-administered medications for this encounter.  Current Outpatient Medications:    amLODipine (NORVASC) 2.5 MG tablet, Take 2.5 mg by mouth daily., Disp: , Rfl:    co-enzyme Q-10 50 MG capsule, Take 100 mg by mouth daily. , Disp: , Rfl:    famotidine  (PEPCID ) 20 MG tablet, Take 20 mg by mouth at bedtime., Disp: , Rfl:    fluticasone (FLONASE) 50 MCG/ACT nasal spray, Place 1 spray into both nostrils as needed for allergies or rhinitis., Disp: , Rfl:    loratadine (CLARITIN) 10 MG tablet, Take 10 mg by mouth as needed for allergies., Disp: , Rfl:    Multiple Vitamin (MULTIVITAMIN) tablet, Take 1 tablet by mouth daily., Disp: , Rfl:    Omega-3 Fatty Acids (OMEGA-3 FISH OIL PO), Take 1,500 mg by mouth daily., Disp: , Rfl:    omeprazole  (PRILOSEC) 20 MG capsule, Take 1 capsule by mouth daily., Disp: , Rfl:    rosuvastatin  (CRESTOR ) 10 MG tablet, Take 10 mg by mouth daily., Disp: , Rfl:    sucralfate  (CARAFATE ) 1 G tablet, Take 1 tablet by mouth 4 (four) times daily. , Disp: , Rfl: 98   sucralfate  (CARAFATE ) 1 g tablet, , Disp: , Rfl:    Allergies  Allergen Reactions   Losartan Swelling    Lip swelling, angiodema   Lisinopril      burning mouth   Protonix  [Pantoprazole  Sodium]     burning mouth    Past Medical History:  Diagnosis Date   Allergic rhinitis    Allergy    Cataract    Diverticulosis    Duodenitis 10/2012   Esophagitis 10/2012   GERD (gastroesophageal reflux disease)    H. pylori infection    HLD (hyperlipidemia)    Hypertension     Kidney stones    Tubular adenoma      Past Surgical History:  Procedure Laterality Date   BACK SURGERY  2015   stenosis   CARDIAC CATHETERIZATION N/A 07/26/2015   Procedure: Left Heart Cath and Coronary Angiography;  Surgeon: Alm LELON Clay, MD;  Location: [facility] INVASIVE CV LAB;  Service: Cardiovascular;  Laterality: N/A;   COLONOSCOPY     NECK SURGERY  2005   TONSILLECTOMY      Family History  Problem Relation Age of Onset   Diabetes Mother    Heart disease Father    Diabetes Brother    Colon polyps Brother    Colon cancer Neg Hx    Stomach cancer Neg Hx    Rectal cancer Neg Hx    Esophageal cancer Neg Hx     Social History   Tobacco Use   Smoking status: Never   Smokeless tobacco: Never  Vaping Use   Vaping status: Never Used  Substance Use Topics   Alcohol use: Not Currently    Comment: occ   Drug use: No    ROS   Objective:   Vitals: BP (!) 156/77 (BP Location: Right Arm)   Pulse 65   Temp 98 F (36.7 C) (Oral)   Resp 16  SpO2 97%   Physical Exam Constitutional:      General: He is not in acute distress.    Appearance: Normal appearance. He is well-developed and normal weight. He is not ill-appearing, toxic-appearing or diaphoretic.  HENT:     Head: Normocephalic and atraumatic.     Right Ear: External ear normal.     Left Ear: External ear normal.     Nose: Nose normal.     Mouth/Throat:     Pharynx: Oropharynx is clear.  Eyes:     General: No scleral icterus.       Right eye: No discharge.        Left eye: No discharge.     Extraocular Movements: Extraocular movements intact.  Cardiovascular:     Rate and Rhythm: Normal rate.  Pulmonary:     Effort: Pulmonary effort is normal.  Musculoskeletal:       Hands:     Cervical back: Normal range of motion.  Neurological:     Mental Status: He is alert and oriented to person, place, and time.  Psychiatric:        Mood and Affect: Mood normal.        Behavior: Behavior normal.         Thought Content: Thought content normal.        Judgment: Judgment normal.    PROCEDURE NOTE: laceration repair Verbal consent obtained from patient.  Local anesthesia with 4cc Lidocaine  2% with epinephrine.  Wound explored for tendon, ligament damage. Wound scrubbed with soap and water and rinsed. Wound closed loosely with #1 4-0 Ethilon (horizontal mattress) sutures.  Wound cleansed and dressed.   Assessment and Plan :   PDMP not reviewed this encounter.  1. Hand pain, left   2. Laceration of left hand without foreign body, initial encounter    Given age and depth of the wound, recommended cephalexin .  Wound loosely approximated.  General wound care reviewed.  Return to clinic in 10 days for suture removal.  Counseled patient on potential for adverse effects with medications prescribed/recommended today, ER and return-to-clinic precautions discussed, patient verbalized understanding.    Christopher Savannah, PA-C 03/26/24 1214

## 2024-05-14 ENCOUNTER — Emergency Department (HOSPITAL_COMMUNITY)

## 2024-05-14 ENCOUNTER — Other Ambulatory Visit: Payer: Self-pay

## 2024-05-14 ENCOUNTER — Ambulatory Visit
Admission: EM | Admit: 2024-05-14 | Discharge: 2024-05-14 | Disposition: A | Discharge status: Home / Self Care | Attending: Emergency Medicine | Admitting: Emergency Medicine

## 2024-05-14 ENCOUNTER — Encounter (HOSPITAL_COMMUNITY): Payer: Self-pay

## 2024-05-14 ENCOUNTER — Emergency Department (HOSPITAL_COMMUNITY): Admission: EM | Admit: 2024-05-14 | Discharge: 2024-05-14 | Disposition: A | Discharge status: Home / Self Care

## 2024-05-14 DIAGNOSIS — R002 Palpitations: Secondary | ICD-10-CM | POA: Diagnosis not present

## 2024-05-14 DIAGNOSIS — R110 Nausea: Secondary | ICD-10-CM | POA: Diagnosis not present

## 2024-05-14 DIAGNOSIS — Z79899 Other long term (current) drug therapy: Secondary | ICD-10-CM | POA: Diagnosis not present

## 2024-05-14 DIAGNOSIS — I10 Essential (primary) hypertension: Secondary | ICD-10-CM | POA: Diagnosis not present

## 2024-05-14 LAB — COMPREHENSIVE METABOLIC PANEL WITH GFR
ALT: 15 U/L (ref 0–44)
AST: 21 U/L (ref 15–41)
Albumin: 3.8 g/dL (ref 3.5–5.0)
Alkaline Phosphatase: 56 U/L (ref 38–126)
Anion gap: 16 — ABNORMAL HIGH (ref 5–15)
BUN: 16 mg/dL (ref 8–23)
CO2: 17 mmol/L — ABNORMAL LOW (ref 22–32)
Calcium: 9.3 mg/dL (ref 8.9–10.3)
Chloride: 107 mmol/L (ref 98–111)
Creatinine, Ser: 1.04 mg/dL (ref 0.61–1.24)
GFR, Estimated: >60 mL/min (ref >60)
Glucose, Bld: 106 mg/dL — ABNORMAL HIGH (ref 70–99)
Potassium: 4.3 mmol/L (ref 3.5–5.1)
Sodium: 140 mmol/L (ref 135–145)
Total Bilirubin: 0.5 mg/dL (ref 0.0–1.2)
Total Protein: 6.9 g/dL (ref 6.5–8.1)

## 2024-05-14 LAB — CBC
HCT: 40.8 % (ref 39.0–52.0)
Hemoglobin: 13.9 g/dL (ref 13.0–17.0)
MCH: 33.7 pg (ref 26.0–34.0)
MCHC: 34.1 g/dL (ref 30.0–36.0)
MCV: 99 fL (ref 80.0–100.0)
Platelets: 239 K/uL (ref 150–400)
RBC: 4.12 MIL/uL — ABNORMAL LOW (ref 4.22–5.81)
RDW: 13.3 % (ref 11.5–15.5)
WBC: 6.2 K/uL (ref 4.0–10.5)
nRBC: 0 % (ref 0.0–0.2)

## 2024-05-14 LAB — TROPONIN I (HIGH SENSITIVITY)
Troponin I (High Sensitivity): 4 ng/L (ref <18)
Troponin I (High Sensitivity): 4 ng/L (ref <18)

## 2024-05-14 LAB — MAGNESIUM: Magnesium: 2.1 mg/dL (ref 1.7–2.4)

## 2024-05-14 LAB — D-DIMER, QUANTITATIVE: D-Dimer, Quant: 0.28 ug{FEU}/mL (ref 0.00–0.50)

## 2024-05-14 MED ORDER — ACETAMINOPHEN 500 MG PO TABS
1000.0000 mg | ORAL_TABLET | Freq: Once | ORAL | Status: DC
  Filled 2024-05-14: qty 2

## 2024-05-14 MED ORDER — ONDANSETRON 4 MG PO TBDP
4.0000 mg | ORAL_TABLET | Freq: Once | ORAL | Status: AC | PRN
  Administered 2024-05-14: 4 mg via ORAL
  Filled 2024-05-14: qty 1

## 2024-05-14 MED ORDER — ALUM & MAG HYDROXIDE-SIMETH 200-200-20 MG/5ML PO SUSP
30.0000 mL | Freq: Once | ORAL | Status: AC
  Administered 2024-05-14: 30 mL via ORAL
  Filled 2024-05-14: qty 30

## 2024-05-14 NOTE — ED Notes (Signed)
 Provider at bedside, obtaining EKG

## 2024-05-14 NOTE — Discharge Instructions (Signed)
 Please go to [facility] Emergency Department for further evaluation.  [address], [place], [place], [address]

## 2024-05-14 NOTE — ED Provider Notes (Signed)
 [facility]    CSN: [id number] Arrival date & time: 05/14/24  1037      History   Chief Complaint Chief Complaint  Patient presents with   Palpitations   Chest Pain   Shortness of Breath   Headache   Nausea    HPI RYE DECOSTE is a 83 y.o. male.  Patient with past history significant for angina, class III, GERD presented to urgent care this morning with concerns of palpitations and nausea.  Reports he woke up around 4:30 AM with the symptoms and symptoms have not subsided.  Endorses a slight pressure feeling in his chest with denies any significant pain.  No reported vomiting or diaphoresis.  Does not currently follow with cardiology.  No history of hypertension hyperlipidemia as far as he is aware.  Denies any heart rhythm issues.   Palpitations Associated symptoms: chest pain and shortness of breath   Chest Pain Associated symptoms: headache, palpitations and shortness of breath   Shortness of Breath Associated symptoms: chest pain and headaches   Headache   Past Medical History:  Diagnosis Date   Allergic rhinitis    Allergy    Cataract    Diverticulosis    Duodenitis 10/2012   Esophagitis 10/2012   GERD (gastroesophageal reflux disease)    H. 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 Authorizing Provider  amLODipine (NORVASC) 2.5 MG tablet Take 2.5 mg by mouth daily.    [provider]  cephALEXin  (KEFLEX ) 500 MG capsule Take 1 capsule (500 mg total) by mouth 3 (three) times daily. 03/26/24   Christopher Savannah, PA-C  co-enzyme Q-10 50 MG capsule Take 100 mg by mouth daily.     [provider]  famotidine  (PEPCID ) 20 MG tablet Take 20 mg by mouth at bedtime.    [provider]  fluticasone (FLONASE) 50 MCG/ACT nasal spray Place 1 spray into both nostrils as needed for allergies or rhinitis.    [provider]  loratadine (CLARITIN) 10 MG tablet Take 10 mg by mouth as needed for allergies.    [provider]  Multiple Vitamin (MULTIVITAMIN) tablet Take 1 tablet by mouth daily.    [provider]  Omega-3 Fatty Acids (OMEGA-3 FISH OIL PO) Take 1,500 mg by mouth daily.    [provider]  omeprazole  (PRILOSEC) 20 MG capsule Take 1 capsule by mouth daily. 10/05/15   [provider]  rosuvastatin  (CRESTOR ) 10 MG  tablet Take 10 mg by mouth daily.    [provider]  sucralfate  (CARAFATE ) 1 G tablet Take 1 tablet by mouth 4 (four) times daily.  08/08/15   [provider]  sucralfate  (CARAFATE ) 1 g tablet     [provider]    Family History Family History  Problem Relation Age of Onset   Diabetes Mother    Heart disease Father    Diabetes Brother    Colon polyps Brother    Colon cancer Neg Hx    Stomach cancer Neg Hx    Rectal cancer Neg Hx    Esophageal cancer Neg Hx     Social History Social History   Tobacco Use   Smoking status: Never   Smokeless tobacco: Never  Vaping Use   Vaping status: Never Used  Substance Use Topics   Alcohol use: Not  Currently    Comment: occ   Drug use: No     Allergies   Losartan, Lisinopril , and Protonix  [pantoprazole  sodium]   Review of Systems Review of Systems  Respiratory:  Positive for shortness of breath.   Cardiovascular:  Positive for chest pain and palpitations.  Neurological:  Positive for headaches.  All other systems reviewed and are negative.    Physical Exam Triage Vital Signs ED Triage Vitals [05/14/24 1042]  Encounter Vitals Group     BP (!) 162/61     Girls Systolic BP Percentile      Girls Diastolic BP Percentile      Boys Systolic BP Percentile      Boys Diastolic BP Percentile      Pulse Rate 72     Resp 18     Temp      Temp src      SpO2 98 %     Weight      Height      Head Circumference      Peak Flow      Pain Score      Pain Loc      Pain Education      Exclude from Growth Chart    No data found.  Updated Vital Signs BP (!) 162/61 (BP Location: Left Arm)   Pulse (!) 45   Temp 97.9 F (36.6 C) (Oral)   Resp 18   SpO2 98%   Visual Acuity Right Eye Distance:   Left Eye Distance:   Bilateral Distance:    Right Eye Near:   Left Eye Near:    Bilateral Near:     Physical Exam Vitals and nursing note reviewed.  Constitutional:      General: He is not in acute distress.    Appearance: He is well-developed.  HENT:     Head: Normocephalic and atraumatic.  Eyes:     Conjunctiva/sclera: Conjunctivae normal.  Cardiovascular:     Rate and Rhythm: Bradycardia present. Rhythm irregular.     Heart sounds: No murmur heard. Pulmonary:     Effort: Pulmonary effort is normal. No respiratory distress.     Breath sounds: Normal breath sounds.  Abdominal:     Palpations: Abdomen is soft.     Tenderness: There is no abdominal tenderness.  Musculoskeletal:        General: No swelling.     Cervical back: Neck supple.  Skin:    General: Skin is warm and dry.     Capillary Refill: Capillary refill takes less than 2 seconds.  Neurological:      Mental  Status: He is alert.  Psychiatric:        Mood and Affect: Mood normal.      UC Treatments / Results  Labs (all labs ordered are listed, but only abnormal results are displayed) Labs Reviewed - No data to display  EKG   Radiology No results found.  Procedures Procedures (including critical care time)  Medications Ordered in UC Medications - No data to display  Initial Impression / Assessment and Plan / UC Course  I have reviewed the triage vital signs and the nursing notes.  Pertinent labs & imaging results that were available during my care of the patient were reviewed by me and considered in my medical decision making (see chart for details).  Patient presented to urgent care today with concerns of palpitations and nausea starting this morning since waking up. Denies vomiting or diaphoresis. Has not previously had cardiac history beyond a prior admission almost 10 years ago for chest pain. Endorses a slight pressure feeling but no shortness of breath.  EKG shows irregularity with frequent PVCs and some coupling indicating likely bigeminy. No consecutive ST elevation in multiple leads.  With patient's history of symptoms this morning and EKG showing frequent PVCs and bradycardia which is new for patient, advised further workup in the ED. Patient transporting POV with wife driving him to the ED. Final Clinical Impressions(s) / UC Diagnoses   Final diagnoses:  Palpitations  Nausea     Discharge Instructions      Please go to [facility] Emergency Department for further evaluation.  [address] [place], [place], [address]    ED Prescriptions   None    PDMP not reviewed this encounter.   Jacque Byron A, PA-C 05/14/24 1102

## 2024-05-14 NOTE — ED Provider Notes (Signed)
 [facility] Provider Note   CSN: [id number] Arrival date & time: 05/14/24  1118     Patient presents with: Palpitations   Aaron Lang is a 83 y.o. male with PMHx GERD, HLD, HTN, angina who presents to ED concerned for left lower chest pressure and palpitations since 4:30AM this morning. Patient also endorsing an intermittent and mild dyspnea that is not associated with rest vs exertion. Patient also with nausea which resolved after ODT zofran . Patient also endorsing difficulties with vertigo x6 months and is following with ENT for this. Patient also endorsing chronic cough but states that this symptom has not worsened recently. Patient denies vomiting or diarrhea. Patient sought care after his wife's heart monitor detected an arrhythmia.   Patient was referred to ED from [facility] for new findings of bradycardia and PVC's.     Palpitations      Prior to Admission medications   Medication Sig Start Date End Date Taking? Authorizing Provider  amLODipine (NORVASC) 2.5 MG tablet Take 2.5 mg by mouth daily.    [provider]  cephALEXin  (KEFLEX ) 500 MG capsule Take 1 capsule (500 mg total) by mouth 3 (three) times daily. 03/26/24   Christopher Shacoya Burkhammer, PA-C  co-enzyme Q-10 50 MG capsule Take 100 mg by mouth daily.     [provider]  famotidine  (PEPCID ) 20 MG tablet Take 20 mg by mouth at bedtime.    [provider]  fluticasone (FLONASE) 50 MCG/ACT nasal spray Place 1 spray into both nostrils as needed for allergies or rhinitis.    [provider]  loratadine (CLARITIN) 10 MG tablet Take 10 mg by mouth as needed for allergies.    [provider]  Multiple Vitamin (MULTIVITAMIN) tablet Take 1 tablet by mouth daily.    [provider]  Omega-3 Fatty Acids (OMEGA-3 FISH OIL PO) Take 1,500 mg by mouth daily.    [provider]  omeprazole  (PRILOSEC) 20 MG capsule Take 1 capsule by mouth daily.  10/05/15   [provider]  rosuvastatin  (CRESTOR ) 10 MG tablet Take 10 mg by mouth daily.    [provider]  sucralfate  (CARAFATE ) 1 G tablet Take 1 tablet by mouth 4 (four) times daily.  08/08/15   [provider]  sucralfate  (CARAFATE ) 1 g tablet     [provider]    Allergies: Losartan, Lisinopril , and Protonix  [pantoprazole  sodium]    Review of Systems  Cardiovascular:  Positive for palpitations.    Updated Vital Signs BP (!) 149/90   Pulse 60   Temp 97.8 F (36.6 C) (Oral)   Resp 19   SpO2 98%   Physical Exam Vitals and nursing note reviewed.  Constitutional:      General: He is not in acute distress.    Appearance: He is not ill-appearing or toxic-appearing.  HENT:     Head: Normocephalic and atraumatic.     Mouth/Throat:     Mouth: Mucous membranes are moist.  Eyes:     General: No scleral icterus.       Right eye: No discharge.        Left eye: No discharge.     Conjunctiva/sclera: Conjunctivae normal.  Cardiovascular:     Rate and Rhythm: Normal rate and regular rhythm.     Pulses: Normal pulses.     Heart sounds: Normal heart sounds. No murmur heard. Pulmonary:     Effort: Pulmonary effort is normal. No respiratory distress.  Breath sounds: Normal breath sounds. No wheezing, rhonchi or rales.  Abdominal:     General: Abdomen is flat. Bowel sounds are normal. There is no distension.     Palpations: Abdomen is soft. There is no mass.     Tenderness: There is no abdominal tenderness.  Musculoskeletal:     Right lower leg: No edema.     Left lower leg: No edema.     Comments: No calf tenderness to palpation.   Skin:    General: Skin is warm and dry.     Findings: No rash.  Neurological:     General: No focal deficit present.     Mental Status: He is alert and oriented to person, place, and time. Mental status is at baseline.  Psychiatric:        Mood and Affect: Mood normal.        Behavior: Behavior normal.      (all labs ordered are listed, but only abnormal results are displayed) Labs Reviewed  COMPREHENSIVE METABOLIC PANEL WITH GFR - Abnormal; Notable for the following components:      Result Value   CO2 17 (*)    Glucose, Bld 106 (*)    Anion gap 16 (*)    All other components within normal limits  CBC - Abnormal; Notable for the following components:   RBC 4.12 (*)    All other components within normal limits  MAGNESIUM  D-DIMER, QUANTITATIVE  TROPONIN I (HIGH SENSITIVITY)  TROPONIN I (HIGH SENSITIVITY)    EKG: EKG Interpretation Date/Time:  Sunday May 14 2024 11:25:32 EDT Ventricular Rate:  75 PR Interval:  180 QRS Duration:  108 QT Interval:  382 QTC Calculation: 426 R Axis:   -52  Text Interpretation: Normal sinus rhythm with sinus arrhythmia Left anterior fascicular block Moderate voltage criteria for LVH, may be normal variant ( R in aVL , Cornell product ) Confirmed by Neysa Clap [phone]) on 05/14/2024 12:59:34 PM  Radiology: DG Chest 2 View Result Date: 05/14/2024 CLINICAL DATA:  Chest pain. EXAM: CHEST - 2 VIEW COMPARISON:  07/25/2015. FINDINGS: The heart size and mediastinal contours are within normal limits. No focal consolidation, pleural effusion, or pneumothorax. Partially visualized ACDF hardware. No acute osseous abnormality. IMPRESSION: No acute cardiopulmonary findings. Electronically Signed   By: Harrietta Sherry M.D.   On: 05/14/2024 12:10     Procedures   Medications Ordered in the ED  acetaminophen  (TYLENOL ) tablet 1,000 mg (1,000 mg Oral Patient Refused/Not Given 05/14/24 1435)  ondansetron  (ZOFRAN -ODT) disintegrating tablet 4 mg (4 mg Oral Given 05/14/24 1126)  alum & mag hydroxide-simeth (MAALOX/MYLANTA) 200-200-20 MG/5ML suspension 30 mL (30 mLs Oral Given 05/14/24 1436)                                    Medical Decision Making Amount and/or Complexity of Data Reviewed Labs: ordered. Radiology: ordered.  Risk OTC drugs. Prescription  drug management.   This patient presents to the ED for concern of chest pain, this involves an extensive number of treatment options, and is a complaint that carries with it a high risk of complications and morbidity.  The differential diagnosis includes acute coronary syndrome, congestive heart failure, pericarditis, pneumonia, pulmonary embolism, tension pneumothorax, esophageal rupture, aortic dissection, cardiac tamponade, musculoskeletal   Co morbidities that complicate the patient evaluation  GERD, HLD, HTN, angina    Additional history obtained:  Additional history obtained from  07/2015 left heart cath: Angiographically only mild to moderate disease involving the proximal circumflex and mid LAD. No culprit lesion to explain angina symptoms. The left ventricular systolic function is normal. Normal LVEDP   Problem List / ED Course / Critical interventions / Medication management  Patient presented for chest pain.  Physical exam reassuring.  Patient afebrile with stable vitals. I Ordered, and personally interpreted labs.  Initial repeat troponin within normal limits.  D-dimer within normal limits.  CBC without leukocytosis or anemia.  CMP with mildly low CO2 of 17 which is creating a mild anion gap at 16.  Magnesium within normal limits. The patient was maintained on a cardiac monitor.  I personally viewed and interpreted the EKG/cardiac monitored which showed an underlying rhythm of: Sinus rhythm. I ordered imaging studies including chest xray to assess for process contributing to patient's symptoms. I independently visualized and interpreted imaging which showed no acute cardiopulmonary disease. I agree with the radiologist interpretation. Patient started feeling better after receiving Zofran  in ED.  Patient now without symptoms.  Shared all results with patient.  Answered all questions.  I will refer patient to cardiologist for his chest pain/palpitations Staffed with Dr. Neysa who  agrees with plan. I have reviewed the patients home medicines and have made adjustments as needed The patient has been appropriately medically screened and/or stabilized in the ED. I have low suspicion for any other emergent medical condition which would require further screening, evaluation or treatment in the ED or require inpatient management. At time of discharge the patient is hemodynamically stable and in no acute distress. I have discussed work-up results and diagnosis with patient and answered all questions. Patient is agreeable with discharge plan. We discussed strict return precautions for returning to the emergency department and they verbalized understanding.     Social Determinants of Health:  none      Final diagnoses:  Palpitations    ED Discharge Orders          Ordered    Ambulatory referral to Cardiology       Comments: If you have not heard from the Cardiology office within the next 72 hours please call [phone].   05/14/24 1448               Hoy Nidia FALCON, PA-C 05/14/24 1450    Neysa Caron PARAS, DO 05/14/24 1515

## 2024-05-14 NOTE — ED Triage Notes (Signed)
 Pt sent from UC for further eval of heart palpitations and nausea that started at 430 this morning that got worse while in church.

## 2024-05-14 NOTE — ED Notes (Signed)
 Patient is being discharged from the Urgent Care and sent to the Emergency Department via POV with spouse . Per O.Zelaya PA-C, patient is in need of higher level of care due to need for further evaluation . Patient is aware and verbalizes understanding of plan of care.  Vitals:   05/14/24 1045 05/14/24 1056  BP:    Pulse: (!) 45   Resp:    Temp:  97.9 F (36.6 C)  SpO2:

## 2024-05-14 NOTE — Discharge Instructions (Signed)
 As discussed, you are to follow-up with cardiology.  I have sent in the referral.  Please also follow-up with your primary care provider.  Seek emergency care if experiencing any new or worsening symptoms.

## 2024-05-14 NOTE — ED Triage Notes (Signed)
 Patient reports having fluttering/ palpitations, nausea, SHOB, central/ chest pain/ pressure/dullness, and headache. The patient states he did his daily walk outside this morning as well. Denies Cardiac history and denies prescribed cardiac/BP medications. Patient has clear speech and is calm at the moment.   Started: 0430 today, Worsening this morning in church   Patients spouse is present.

## 2024-05-18 ENCOUNTER — Encounter: Payer: Self-pay | Admitting: Family Medicine

## 2024-05-18 ENCOUNTER — Encounter: Payer: Self-pay | Admitting: Cardiology

## 2024-05-18 ENCOUNTER — Ambulatory Visit: Attending: Cardiology | Admitting: Cardiology

## 2024-05-18 ENCOUNTER — Ambulatory Visit (INDEPENDENT_AMBULATORY_CARE_PROVIDER_SITE_OTHER)

## 2024-05-18 VITALS — BP 162/90 | HR 77 | Resp 16 | Ht 67.0 in | Wt 142.0 lb

## 2024-05-18 DIAGNOSIS — R002 Palpitations: Secondary | ICD-10-CM | POA: Diagnosis present

## 2024-05-18 DIAGNOSIS — E782 Mixed hyperlipidemia: Secondary | ICD-10-CM | POA: Diagnosis present

## 2024-05-18 DIAGNOSIS — I517 Cardiomegaly: Secondary | ICD-10-CM | POA: Diagnosis present

## 2024-05-18 DIAGNOSIS — I499 Cardiac arrhythmia, unspecified: Secondary | ICD-10-CM | POA: Insufficient documentation

## 2024-05-18 MED ORDER — ROSUVASTATIN CALCIUM 10 MG PO TABS: 10.0000 mg | ORAL_TABLET | Freq: Every day | ORAL | 3 refills | Status: AC

## 2024-05-18 NOTE — Addendum Note (Signed)
 Addended by: MANDA LYLE NOVAK on: 05/18/2024 04:59 PM   Modules accepted: Orders

## 2024-05-18 NOTE — Progress Notes (Unsigned)
 ZIO serial # [id number] from office inventory applied to patient.

## 2024-05-18 NOTE — Patient Instructions (Addendum)
 MEDICATION: Crestor  10 mg started after receiving labs from PCP.   Testing/Procedures: ZIO FOR 2 WEEKS   Your physician has requested that you wear a Zio heart monitor for __14___ days. This will be mailed to your home with instructions on how to apply the monitor and how to return it when finished. Please allow 2 weeks after returning the heart monitor before our office calls you with the results.   ECHOCARDIOGRAM  Your physician has requested that you have an echocardiogram. Echocardiography is a painless test that uses sound waves to create images of your heart. It provides your doctor with information about the size and shape of your heart and how well your heart's chambers and valves are working. This procedure takes approximately one hour. There are no restrictions for this procedure. Please do NOT wear cologne, perfume, aftershave, or lotions (deodorant is allowed). Please arrive 15 minutes prior to your appointment time.  Please note: We ask at that you not bring children with you during ultrasound (echo/ vascular) testing. Due to room size and safety concerns, children are not allowed in the ultrasound rooms during exams. Our front office staff cannot provide observation of children in our lobby area while testing is being conducted. An adult accompanying a patient to their appointment will only be allowed in the ultrasound room at the discretion of the ultrasound technician under special circumstances. We apologize for any inconvenience.  Follow-Up: At [facility], you and your health needs are our priority.  As part of our continuing mission to provide you with exceptional heart care, our providers are all part of one team.  This team includes your primary Cardiologist (physician) and Advanced Practice Providers or APPs (Physician Assistants and Nurse Practitioners) who all work together to provide you with the care you need, when you need it.  Your next appointment:   4-8  week(s)  Provider:   Newman JINNY Lawrence, MD

## 2024-05-18 NOTE — Progress Notes (Signed)
 Cardiology Office Note:  .   Date:  05/18/2024  ID:  Aaron Lang, DOB Jan 24, 1941, MRN [id number] PCP: Loring Tanda Mae, MD  [facility] Providers Cardiologist:  Newman Lawrence, MD PCP: Loring Tanda Mae, MD  Chief Complaint  Patient presents with   Palpitations   New Patient (Initial Visit)     Aaron Lang is a 83 y.o. male with hypertension, hyperlipidemia, GERD, nonobstructive coronary artery disease (cath 2016), referred for palpitations  Discussed the use of AI scribe software for clinical note transcription with the patient, who gave verbal consent to proceed.  History of Present Illness Aaron Lang is an 83 year old male with mild coronary artery disease who presents with palpitations and chest fluttering. He is accompanied by his wife. He was referred by the emergency room for follow-up after experiencing palpitations.  He experiences palpitations and a fluttering sensation in his chest, which began after consuming strong coffee. The initial episode was severe, leading to urgent care and emergency room visits, where an EKG, blood work, and X-ray were performed. A subsequent episode occurred during yard work after coffee consumption, with less intense palpitations but an arrhythmia indicated by his home blood pressure machine.  He underwent a heart catheterization approximately ten years ago, revealing mild coronary artery disease without severe blockages. He was recently evaluated in the emergency room for palpitations, with blood work and imaging tests conducted.  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 He manages white coat hypertension with home blood pressure readings typically in the 120s or lower. He discontinued Crestor  after consulting with his family physician, despite slightly elevated cholesterol levels. He does not smoke or drink. He experiences occasional chest discomfort but no severe  pain.      Vitals:   05/18/24 0858  BP: (!) 162/90  Pulse: 77  Resp: 16  SpO2: 98%      Review of Systems  Cardiovascular:  Positive for palpitations. Negative for chest pain, dyspnea on exertion, leg swelling and syncope.        Studies Reviewed: Aaron Lang        EKG 05/15/2024: Sinus rhythm 75 bpm with sinus arrhythmia Left intrafascicular block LVH  Coronary angiogram 2016: Angiographically only mild to moderate disease involving the proximal circumflex and mid LAD. No culprit lesion to explain angina symptoms. The left ventricular systolic function is normal. Normal LVEDP     Labs 04/2024: Chol 221, TG 82, HDL 52, LDL 154 Hb 13.9 Cr 1.04 Trop HS 4, 4 D-dimer 0.28  2016: Chol 233, TG 272, HDL 37, LDL 142    Physical Exam Vitals and nursing note reviewed.  Constitutional:      General: He is not in acute distress. Neck:     Vascular: No JVD.  Cardiovascular:     Rate and Rhythm: Normal rate and regular rhythm.     Heart sounds: Normal heart sounds. No murmur heard. Pulmonary:     Effort: Pulmonary effort is normal.     Breath sounds: Normal breath sounds. No wheezing or rales.  Musculoskeletal:     Right lower leg: No edema.     Left lower leg: No edema.      VISIT DIAGNOSES:   ICD-10-CM   1. Mixed hyperlipidemia  E78.2     2. Palpitations  R00.2 LONG TERM MONITOR (3-14 DAYS)    ECHOCARDIOGRAM COMPLETE    3. Irregular heart beat  I49.9 LONG TERM MONITOR (3-14 DAYS)  ECHOCARDIOGRAM COMPLETE    4. Left ventricular hypertrophy  I51.7 ECHOCARDIOGRAM COMPLETE       JONAN SEUFERT is a 83 y.o. male with hypertension, hyperlipidemia, GERD, nonobstructive coronary artery disease (cath 2016), referred for palpitations  Assessment & Plan Palpitations with possible cardiac arrhythmia: Intermittent palpitations with nonspecific EKG changes. Troponin and D-dimer negative for myocardial infarction and pulmonary embolism. Differential includes PVCs,  PACs, and AFib. AFib risk due to age and mild coronary artery disease. Discussed potential correlation with sleep apnea and stroke risk assessment. - Provide two-week heart monitor. Consider 30-day monitor if asymptomatic. - Order echocardiogram to evaluate cardiac structure and function. - Review heart monitor and echocardiogram results in 4-6 weeks. - Obtain lipid panel results from Dr. Loring. - Consider extended monitoring if symptoms persist or initial monitoring is inconclusive.  White coat hypertension: Elevated blood pressure in clinical settings due to anxiety. Normal home readings. Echocardiogram to evaluate structural heart issues. - Monitor blood pressure at home regularly. - Evaluate echocardiogram results for structural heart issues affecting blood pressure.   Addendum: Lipid panel results from PCP were obtained.  LDL elevated at 154.  He is a very active 83 year old, would still benefit from risk factor modification.  I would suggest Crestor  10 mg daily.  I contacted the patient, but he was out on his daily walk.  Will follow-up on this.    F/u in 4-8 weeks  Signed, Newman JINNY Lawrence, MD

## 2024-06-08 ENCOUNTER — Ambulatory Visit: Payer: Self-pay | Admitting: Cardiology

## 2024-06-08 DIAGNOSIS — I499 Cardiac arrhythmia, unspecified: Secondary | ICD-10-CM | POA: Diagnosis not present

## 2024-06-08 DIAGNOSIS — R002 Palpitations: Secondary | ICD-10-CM | POA: Diagnosis not present

## 2024-06-13 ENCOUNTER — Ambulatory Visit: Admitting: Cardiology

## 2024-06-20 ENCOUNTER — Emergency Department (HOSPITAL_COMMUNITY)
Admission: EM | Admit: 2024-06-20 | Discharge: 2024-06-20 | Disposition: A | Discharge status: Home / Self Care | Attending: Emergency Medicine | Admitting: Emergency Medicine

## 2024-06-20 ENCOUNTER — Emergency Department (HOSPITAL_COMMUNITY)

## 2024-06-20 ENCOUNTER — Encounter (HOSPITAL_COMMUNITY): Payer: Self-pay

## 2024-06-20 ENCOUNTER — Other Ambulatory Visit: Payer: Self-pay

## 2024-06-20 DIAGNOSIS — R0602 Shortness of breath: Secondary | ICD-10-CM | POA: Diagnosis not present

## 2024-06-20 DIAGNOSIS — R0600 Dyspnea, unspecified: Secondary | ICD-10-CM | POA: Insufficient documentation

## 2024-06-20 DIAGNOSIS — R110 Nausea: Secondary | ICD-10-CM | POA: Insufficient documentation

## 2024-06-20 DIAGNOSIS — R079 Chest pain, unspecified: Secondary | ICD-10-CM | POA: Insufficient documentation

## 2024-06-20 DIAGNOSIS — R0981 Nasal congestion: Secondary | ICD-10-CM | POA: Insufficient documentation

## 2024-06-20 LAB — BASIC METABOLIC PANEL WITH GFR
Anion gap: 11 (ref 5–15)
BUN: 8 mg/dL (ref 8–23)
CO2: 23 mmol/L (ref 22–32)
Calcium: 9.2 mg/dL (ref 8.9–10.3)
Chloride: 107 mmol/L (ref 98–111)
Creatinine, Ser: 0.93 mg/dL (ref 0.61–1.24)
GFR, Estimated: >60 mL/min (ref >60)
Glucose, Bld: 100 mg/dL — ABNORMAL HIGH (ref 70–99)
Potassium: 3.8 mmol/L (ref 3.5–5.1)
Sodium: 141 mmol/L (ref 135–145)

## 2024-06-20 LAB — TROPONIN I (HIGH SENSITIVITY)
Troponin I (High Sensitivity): 4 ng/L (ref <18)
Troponin I (High Sensitivity): 4 ng/L (ref <18)

## 2024-06-20 LAB — HEPATIC FUNCTION PANEL
ALT: 23 U/L (ref 0–44)
AST: 22 U/L (ref 15–41)
Albumin: 3.6 g/dL (ref 3.5–5.0)
Alkaline Phosphatase: 56 U/L (ref 38–126)
Bilirubin, Direct: 0.1 mg/dL (ref 0.0–0.2)
Indirect Bilirubin: 0.7 mg/dL (ref 0.3–0.9)
Total Bilirubin: 0.8 mg/dL (ref 0.0–1.2)
Total Protein: 6.5 g/dL (ref 6.5–8.1)

## 2024-06-20 LAB — D-DIMER, QUANTITATIVE: D-Dimer, Quant: <0.27 ug{FEU}/mL (ref 0.00–0.50)

## 2024-06-20 LAB — CBC
HCT: 41.5 % (ref 39.0–52.0)
Hemoglobin: 13.7 g/dL (ref 13.0–17.0)
MCH: 32.9 pg (ref 26.0–34.0)
MCHC: 33 g/dL (ref 30.0–36.0)
MCV: 99.8 fL (ref 80.0–100.0)
Platelets: 213 K/uL (ref 150–400)
RBC: 4.16 MIL/uL — ABNORMAL LOW (ref 4.22–5.81)
RDW: 13.5 % (ref 11.5–15.5)
WBC: 5.3 K/uL (ref 4.0–10.5)
nRBC: 0 % (ref 0.0–0.2)

## 2024-06-20 LAB — RESP PANEL BY RT-PCR (RSV, FLU A&B, COVID)  RVPGX2
Influenza A by PCR: NEGATIVE
Influenza B by PCR: NEGATIVE
Resp Syncytial Virus by PCR: NEGATIVE
SARS Coronavirus 2 by RT PCR: NEGATIVE

## 2024-06-20 MED ORDER — ASPIRIN 325 MG PO TBEC
325.0000 mg | DELAYED_RELEASE_TABLET | Freq: Once | ORAL | Status: AC
  Administered 2024-06-20: 325 mg via ORAL
  Filled 2024-06-20: qty 1

## 2024-06-20 NOTE — Discharge Instructions (Signed)
 You're evaluated in the emergency department today for chest pain.  You were evaluated with heart tracings, heart enzymes, chest x-Brindley Madarang, and other labs.  These all appeared normal at this time.  However, if your pain changes or you begin having new symptoms you should be reevaluated immediately.  You should have close follow-up with your primary care doctor.  Please call them today to arrange for follow-up this week. Return if you are having any new or worsening symptoms at any time

## 2024-06-20 NOTE — ED Provider Notes (Addendum)
 [place] EMERGENCY DEPARTMENT AT [facility] Provider Note   CSN: [id number] Arrival date & time: 06/20/24  9373     Patient presents with: Chest Pain   Aaron Lang is a 83 y.o. male.   HPI 83 year old male history of GERD, high cholesterol, presents today complaining of right-sided chest pain.  He states this been present for 4 days.  He is about a 3 and sharp when it occurs.  It waxes and wanes.  It is worse with moving his arms and with deep breathing.  He he feels mild dyspnea.  He has been walking his usual 4 miles per day without difficulty.  He denies any fever or chills.  He has had some runny nose.  He denies any cough.  He has had mild nausea but no vomiting or diarrhea.  He is not currently on blood thinners.  He denies any headache, head injury, trauma, risk factors for PE, history of DVT or PE, risk factors for DVT.    Prior to Admission medications   Medication Sig Start Date End Date Taking? Authorizing Provider  famotidine  (PEPCID ) 20 MG tablet Take 20 mg by mouth daily.   Yes [provider]  fluticasone (FLONASE) 50 MCG/ACT nasal spray Place 1 spray into both nostrils as needed for allergies or rhinitis.   Yes [provider]  loratadine (CLARITIN) 10 MG tablet Take 10 mg by mouth as needed for allergies.   Yes [provider]  Multiple Vitamin (MULTIVITAMIN) tablet Take 1 tablet by mouth daily.   Yes [provider]  rosuvastatin  (CRESTOR ) 10 MG tablet Take 1 tablet (10 mg total) by mouth daily. 05/18/24  Yes Patwardhan, Manish J, MD  sucralfate  (CARAFATE ) 1 g tablet Take 1 g by mouth daily.   Yes [provider]    Allergies: Losartan and Lisinopril     Review of Systems  Updated Vital Signs BP 134/67   Pulse 67   Temp 98 F (36.7 C) (Oral)   Resp 13   Ht 1.702 m (5' 7)   Wt 63.5 kg   SpO2 97%   BMI 21.93 kg/m   Physical Exam Vitals reviewed.  Constitutional:      Appearance: He is  well-developed.  HENT:     Head: Normocephalic.  Eyes:     Pupils: Pupils are equal, round, and reactive to light.  Cardiovascular:     Rate and Rhythm: Normal rate and regular rhythm.     Heart sounds: Normal heart sounds.  Pulmonary:     Effort: Pulmonary effort is normal.     Breath sounds: Normal breath sounds.  Chest:     Chest wall: Tenderness present.     Comments: Mild tenderness right anterior chest but unclear if this reproduces.  There is no external sign of trauma cellulitis or crepitus Abdominal:     General: Bowel sounds are normal.     Palpations: Abdomen is soft.     Comments: Mild right upper quadrant tenderness palpation  Musculoskeletal:        General: Normal range of motion.     Cervical back: Normal range of motion.     Right lower leg: No tenderness. No edema.     Left lower leg: No tenderness. No edema.  Skin:    General: Skin is warm and dry.     Capillary Refill: Capillary refill takes less than 2 seconds.  Neurological:     General: No focal deficit present.  Mental Status: He is alert.     (all labs ordered are listed, but only abnormal results are displayed) Labs Reviewed  BASIC METABOLIC PANEL WITH GFR - Abnormal; Notable for the following components:      Result Value   Glucose, Bld 100 (*)    All other components within normal limits  CBC - Abnormal; Notable for the following components:   RBC 4.16 (*)    All other components within normal limits  RESP PANEL BY RT-PCR (RSV, FLU A&B, COVID)  [id number]  HEPATIC FUNCTION PANEL  D-DIMER, QUANTITATIVE  TROPONIN I (HIGH SENSITIVITY)  TROPONIN I (HIGH SENSITIVITY)    EKG: None  Radiology: DG Chest 2 View Result Date: 06/20/2024 CLINICAL DATA:  Chest pain EXAM: CHEST - 2 VIEW COMPARISON:  05/14/2024 FINDINGS: The lungs are clear without focal pneumonia, edema, pneumothorax or pleural effusion. The cardiopericardial silhouette is within normal limits for size. No acute bony abnormality.  IMPRESSION: No active cardiopulmonary disease. Electronically Signed   By: Camellia Candle M.D.   On: 06/20/2024 07:16     .Critical Care  Performed by: Levander Houston, MD Authorized by: Levander Houston, MD   Critical care provider statement:    Critical care time (minutes):  30   Critical care end time:  06/20/2024 11:55 AM   Critical care time was exclusive of:  Separately billable procedures and treating other patients and teaching time   Critical care was time spent personally by me on the following activities:  Development of treatment plan with patient or surrogate, discussions with consultants, evaluation of patient's response to treatment, examination of patient, ordering and review of laboratory studies, ordering and review of radiographic studies, ordering and performing treatments and interventions, pulse oximetry, re-evaluation of patient's condition and review of old charts    Medications Ordered in the ED  aspirin  EC tablet 325 mg (325 mg Oral Given 06/20/24 0833)    Clinical Course as of 06/20/24 1155  Tue Jun 20, 2024  0856 CBC reviewed and interpreted and within normal limits Basic metabolic panel reviewed interpreted within normal limits [DR]  1151 Troponin and repeat troponin reviewed interpreted negative [DR]    Clinical Course User Index [DR] Levander Houston, MD                                 Medical Decision Making Amount and/or Complexity of Data Reviewed Labs: ordered. Radiology: ordered.  Risk OTC drugs.   83 year old male presents today with several days of sharp right sided sided chest pain.  Pain is within about a 1 cm diameter right side of his chest.  It is worse with movement, inspiration, and palpation.  Patient is evaluated here with EKG, labs, and chest x-Leni Pankonin Patient seen and evaluated for chest pain.  Differential diagnosis of serious/life threatening causes of chest pain includes ACS, other diseases of the heart such as myocarditis or pericarditis,  lung etiologies such as infection or pneumothorax, diseases of the great vessels such as aortic dissection or AAA, pulmonary embolism, or GI sources such as cholecystitis or other upper abdominal causes. Doubt ACS- heart score documented, EKG reviewed, Given the timing of pain to ER presentation, 4 so doubt NSTEMI troponin and repeat troponin obtained and WNL Doubt myocarditis/pericarditis/tamponade based on history, review of ekg and labs Doubt aortic dissection based on history and review of imaging Doubt intrinsic lung causes such as pneumonia or pneumothorax, based on history, physical exam,  and studies obtained. Doubt PE based on history, physical exam, and PERC Doubt acute GI etiology requiring intervention based on history, physical exam and labs. Patient appears stable for discharge. Return precautions and need for follow up discussed and patient voices understanding     Final diagnoses:  Chest pain, unspecified type    ED Discharge Orders     None          Levander Houston, MD 06/20/24 1153    Levander Houston, MD 06/20/24 1155

## 2024-06-20 NOTE — ED Notes (Signed)
 Patient transported to X-ray

## 2024-06-20 NOTE — ED Triage Notes (Signed)
 Pt arrived from home via POV c/o right sided chest pain 6/10 described as pressure radiating to right jaw and upper back accompanied with SOB.

## 2024-06-26 ENCOUNTER — Ambulatory Visit (HOSPITAL_COMMUNITY)
Admission: RE | Admit: 2024-06-26 | Discharge: 2024-06-26 | Disposition: A | Discharge status: Home / Self Care | Source: Ambulatory Visit | Attending: Internal Medicine | Admitting: Internal Medicine

## 2024-06-26 DIAGNOSIS — I10 Essential (primary) hypertension: Secondary | ICD-10-CM

## 2024-06-26 DIAGNOSIS — I517 Cardiomegaly: Secondary | ICD-10-CM | POA: Insufficient documentation

## 2024-06-26 DIAGNOSIS — I499 Cardiac arrhythmia, unspecified: Secondary | ICD-10-CM | POA: Diagnosis present

## 2024-06-26 DIAGNOSIS — R002 Palpitations: Secondary | ICD-10-CM | POA: Diagnosis present

## 2024-06-26 LAB — ECHOCARDIOGRAM COMPLETE
Area-P 1/2: 3.15 cm2
S' Lateral: 2.7 cm

## 2024-07-04 ENCOUNTER — Ambulatory Visit: Admitting: Cardiology

## 2024-07-10 ENCOUNTER — Institutional Professional Consult (permissible substitution) (INDEPENDENT_AMBULATORY_CARE_PROVIDER_SITE_OTHER)

## 2024-07-10 ENCOUNTER — Ambulatory Visit (INDEPENDENT_AMBULATORY_CARE_PROVIDER_SITE_OTHER): Admitting: Audiology

## 2024-07-26 ENCOUNTER — Institutional Professional Consult (permissible substitution) (INDEPENDENT_AMBULATORY_CARE_PROVIDER_SITE_OTHER)

## 2024-07-31 ENCOUNTER — Ambulatory Visit (INDEPENDENT_AMBULATORY_CARE_PROVIDER_SITE_OTHER)

## 2024-07-31 ENCOUNTER — Encounter (INDEPENDENT_AMBULATORY_CARE_PROVIDER_SITE_OTHER): Payer: Self-pay

## 2024-07-31 VITALS — BP 165/76 | HR 76 | Temp 98.5°F | Ht 67.0 in | Wt 140.0 lb

## 2024-07-31 DIAGNOSIS — R208 Other disturbances of skin sensation: Secondary | ICD-10-CM | POA: Diagnosis not present

## 2024-07-31 DIAGNOSIS — H8112 Benign paroxysmal vertigo, left ear: Secondary | ICD-10-CM

## 2024-07-31 MED ORDER — LIDOCAINE-ALOE VERA 0.5 % EX GEL: 1.0000 | CUTANEOUS | 0 refills | Status: AC | PRN

## 2024-07-31 NOTE — Patient Instructions (Signed)
###   Epley Maneuver Instructions     **Instructions for Performing the Epley Maneuver**      1. **Start Position:** Have the patient sit upright on an exam table with legs extended. Turn the head 45 toward the affected ear (the side provoking vertigo).[1][2]      2. **Lie Back:** Assist the patient to lie back quickly so the head hangs slightly off the edge of the table (about 20 extension), still turned 45 toward the affected side. Hold this position for at least 30 seconds, or until any vertigo and nystagmus resolve.[1][2][3]      3. **Rotate Head:** Without lifting the head, turn it 90 toward the opposite (unaffected) side, so the face is now turned 45 toward the unaffected ear. Hold for at least 30 seconds.[1][2][3]      4. **Roll Body:** Ask the patient to roll onto their side in the direction they are facing, so the head is now turned downward (toward the floor) and the nose points down at a 45 angle. Hold for at least 30 seconds.[1][2][3]      5. **Sit Up:** Help the patient sit up slowly, keeping the head turned toward the unaffected side. Once upright, the head can be returned to neutral.[1][2][3]      **Tips:**      - Each position should be held for at least 30 seconds, or until symptoms resolve.      - A pillow under the shoulders may improve comfort and facilitate correct head extension.[3]      - After the maneuver, advise the patient to remain seated for about 15 minutes and to walk cautiously.[1]      - The maneuver may be repeated 2-3 times in one session if symptoms persist.[3][1]      **Contraindications:** Avoid the maneuver in patients with severe neck disease, unstable heart disease, or high-grade carotid stenosis.[4]      The Epley maneuver is highly effective, with symptom resolution rates up to 80-95% after one or more sessions.[1][2][3]      ### References  1. Benign Paroxysmal Positional Vertigo. Luke HARLAND North DS. The [place] Journal of Medicine.  2014;370(12):1138-47. doi:10.1056/NEJMcp1309481. 2. Clinical Practice Guideline: Benign Paroxysmal Positional Vertigo (Update). Milta SAILOR, Gubbels SP, Schwartz SR, et al. Otolaryngology--Head and Neck Surgery : Official Journal of American Academy of Otolaryngology-Head and Neck Surgery. 2017;156(3_suppl):S1-S47. doi:10.1177/0194599816689667. 3. The Semont-Plus Maneuver or the Epley Maneuver in Posterior Canal Benign Paroxysmal Positional Vertigo: A Randomized Clinical Study. Stevan CHRISTELLA Maurene CHRISTELLA, Vinck AS, et al. JAMA Neurology. 2023;80(8):798-804. doi:10.1001/jamaneurol.2023.1408. 4. Benign Paroxysmal Positional Vertigo. Danetta ONA Lesch SP. The [place] Journal of Medicine. 1999;341(21):1590-6. doi:10.1056/NEJM199911183412107.

## 2024-07-31 NOTE — Progress Notes (Signed)
 Dear Dr. Loring, Here is my assessment for our mutual patient, Aaron Lang. Thank you for allowing me the opportunity to care for your patient. Please do not hesitate to contact me should you have any other questions. Sincerely, Dr. Penne Croak  Otolaryngology Clinic Note Referring provider: Dr. Loring HPI:  Discussed the use of AI scribe software for clinical note transcription with the patient, who gave verbal consent to proceed.  History of Present Illness Aaron Lang is an 83 year old male who presents with dizziness and burning sensation in the ear. He was referred by Dr. Loring for evaluation of dizziness.  Vertigo - Dizziness characterized by room-spinning sensation occurs exclusively when sleeping on right side - Vertigo persists as long as he remains on right side and resolves after sitting up for a few minutes - No vertigo during daytime - Son has experienced similar dizziness and was diagnosed with BPPV - MRI of head and cervical spine performed following previous neck surgery in 2000  Red ear syndrome - Burning sensation primarily affects right ear - Triggered by heat and warmth, especially at night - Burning can last up to an hour - Relief achieved by applying a cold cloth to the ear - Burning occurs most nights and sometimes during the day if exposed to heat without ear protection - Uses pillow with a hole to alleviate symptoms when sleeping on left side  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 pylori infection    HLD  (hyperlipidemia)    Hypertension    Kidney stones    Tubular adenoma      Past Surgical History:  Procedure Laterality Date   BACK SURGERY  2015   stenosis   CARDIAC CATHETERIZATION N/A 07/26/2015   Procedure: Left Heart Cath and Coronary Angiography;  Surgeon: Alm LELON Clay, MD;  Location: [facility] INVASIVE CV LAB;  Service: Cardiovascular;  Laterality: N/A;   COLONOSCOPY     NECK SURGERY  2005   TONSILLECTOMY      Family History  Problem Relation Age of Onset   Diabetes Mother    Heart disease Father    Diabetes Brother    Colon polyps Brother    Colon cancer Neg Hx    Stomach cancer Neg Hx    Rectal cancer Neg Hx    Esophageal cancer Neg Hx      Social Connections: Unknown (07/07/2022)   Received from [facility]   Social Network    Social Network: Not on file      Current Outpatient Medications:    Lidocaine -Aloe Vera 0.5 % GEL, Apply 1 Dose topically as needed., Disp: 226 g, Rfl: 0   famotidine  (PEPCID ) 20 MG tablet, Take 20 mg by mouth daily., Disp: , Rfl:    fluticasone (FLONASE) 50 MCG/ACT nasal spray, Place 1 spray into both nostrils as needed for allergies or rhinitis., Disp: , Rfl:    loratadine (CLARITIN) 10 MG tablet, Take 10 mg by mouth as needed for allergies., Disp: , Rfl:    Multiple Vitamin (MULTIVITAMIN) tablet,  Take 1 tablet by mouth daily., Disp: , Rfl:    rosuvastatin  (CRESTOR ) 10 MG tablet, Take 1 tablet (10 mg total) by mouth daily., Disp: 90 tablet, Rfl: 3   sucralfate  (CARAFATE ) 1 g tablet, Take 1 g by mouth daily., Disp: , Rfl:    Physical Exam:   BP (!) 165/76 Comment: first  attempt 176/77  Pulse 76   Temp 98.5 F (36.9 C)   Ht 5' 7 (1.702 m)   Wt 140 lb (63.5 kg)   SpO2 98%   BMI 21.93 kg/m   The patient was awake, alert, and appropriate. The external ears were inspected, and otoscopy was performed to evaluate the external auditory canals and tympanic membranes. The nasal cavity and septum were examined for mucosal changes,  obstruction, or discharge. The oral cavity and oropharynx were inspected for mucosal lesions, infection, or tonsillar hypertrophy. The neck was palpated for lymphadenopathy, thyroid abnormalities, or other masses. Cranial nerve function was grossly intact.  Pertinent Findings: Physical Exam HEENT: Cerumen present in left ear. Dix hallpike negative b/l  Seprately Identifiable Procedures:  I personally ordered, reviewed and interpreted the following with the patient today  Impression & Plans:  Aaron Lang is a 83 y.o. male  1. 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 Burning sensation    - Findings and diagnoses discussed in detail with the patient. - Risks, benefits, and alternatives were reviewed. Through shared decision making, the patient elects to proceed with below. Assessment & Plan Benign paroxysmal positional vertigo Intermittent vertigo likely due to loose otoliths. Negative Dix-Hallpike. Vitamin D deficiency may contribute to recurrence. - Referred to physical therapy for Epley maneuver training. - Recommended vitamin D supplementation.  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 Capsaicin cream and lidocaine  gel considered for relief. Indomethacin not recommended. - Apply antiperspirant deodorant with aluminum to the right ear before bed. - Use lidocaine  gel for topical numbing. - Consider capsaicin cream. - Avoid indomethacin, hx of NSAID Gastritis.  - Orders placed:  Orders Placed This Encounter  Procedures   Ambulatory referral to Physical Therapy   - Medications prescribed/continued/adjusted:  Meds ordered this encounter  Medications   Lidocaine -Aloe Vera 0.5 % GEL    Sig: Apply 1 Dose topically as needed.    Dispense:  226 g    Refill:  0   - Education materials provided to the patient. - Follow up: 8 weeks. Patient  instructed to return sooner or go to the ED if new/worsening symptoms develop.   Thank you for allowing me the opportunity to care for your patient. Please do not hesitate to contact me should you have any other questions.  Sincerely, Penne Croak, DO Otolaryngologist (ENT) [facility] Phone: [phone] Fax: [phone]  07/31/2024, 3:41 PM

## 2024-08-04 ENCOUNTER — Encounter: Payer: Self-pay | Admitting: Cardiology

## 2024-08-04 ENCOUNTER — Ambulatory Visit: Attending: Cardiology | Admitting: Cardiology

## 2024-08-04 ENCOUNTER — Other Ambulatory Visit (HOSPITAL_COMMUNITY): Payer: Self-pay

## 2024-08-04 VITALS — BP 148/73 | HR 60 | Ht 67.0 in | Wt 143.0 lb

## 2024-08-04 DIAGNOSIS — E782 Mixed hyperlipidemia: Secondary | ICD-10-CM | POA: Insufficient documentation

## 2024-08-04 DIAGNOSIS — I471 Supraventricular tachycardia, unspecified: Secondary | ICD-10-CM | POA: Diagnosis present

## 2024-08-04 DIAGNOSIS — I4729 Other ventricular tachycardia: Secondary | ICD-10-CM | POA: Diagnosis present

## 2024-08-04 LAB — LIPID PANEL
Chol/HDL Ratio: 2.8 ratio (ref 0.0–5.0)
Cholesterol, Total: 126 mg/dL (ref 100–199)
HDL: 45 mg/dL (ref >39)
LDL Chol Calc (NIH): 63 mg/dL (ref 0–99)
Triglycerides: 96 mg/dL (ref 0–149)
VLDL Cholesterol Cal: 18 mg/dL (ref 5–40)

## 2024-08-04 MED ORDER — METOPROLOL SUCCINATE ER 25 MG PO TB24
25.0000 mg | ORAL_TABLET | Freq: Every evening | ORAL | 3 refills | Status: AC
  Filled 2024-08-04: qty 90, 90d supply, fill #0
  Filled 2024-10-11: qty 90, 90d supply, fill #1

## 2024-08-04 NOTE — Patient Instructions (Signed)
 Medication Instructions:  START Metoprolol XL 25 mg nightly   *If you need a refill on your cardiac medications before your next appointment, please call your pharmacy*  Lab Work: LIPID PANEL   If you have labs (blood work) drawn today and your tests are completely normal, you will receive your results only by: MyChart Message (if you have MyChart) OR A paper copy in the mail If you have any lab test that is abnormal or we need to change your treatment, we will call you to review the results.  Follow-Up: At [facility], you and your health needs are our priority.  As part of our continuing mission to provide you with exceptional heart care, our providers are all part of one team.  This team includes your primary Cardiologist (physician) and Advanced Practice Providers or APPs (Physician Assistants and Nurse Practitioners) who all work together to provide you with the care you need, when you need it.  Your next appointment:   6 month(s)  Provider:   Newman JINNY Lawrence, MD

## 2024-08-04 NOTE — Progress Notes (Signed)
 Cardiology Office Note:  .   Date:  08/04/2024  ID:  Aaron Lang, DOB September 13, 1941, MRN [id number] PCP: Loring Tanda Mae, MD  [facility] Providers Cardiologist:  Newman Lawrence, MD PCP: Loring Tanda Mae, MD  Chief Complaint  Patient presents with   Palpitations     Aaron Lang is a 83 y.o. male with hypertension, hyperlipidemia, GERD, nonobstructive coronary artery disease (cath 2016), referred for palpitations  Discussed the use of AI scribe software for clinical note transcription with the patient, who gave verbal consent to proceed.  History of Present Illness Patient has occasional palpitation symptoms for, but otherwise doing well.  He is tolerating Crestor  10 mg daily.  Blood pressure elevated during office visit, but fairly well-controlled at home.  Reviewed recent monitor and echocardiogram results with the patient, details below.      Vitals:   08/04/24 0853  BP: (!) 148/73  Pulse: 60  SpO2: 99%      Review of Systems  Cardiovascular:  Positive for palpitations. Negative for chest pain, dyspnea on exertion, leg swelling and syncope.        Studies Reviewed: Aaron Lang        EKG 05/15/2024: Sinus rhythm 75 bpm with sinus arrhythmia Left intrafascicular block LVH  Zio patch monitor 13 days 05/18/2024 - 06/01/2024: Dominant rhythm: Sinus. HR 46-111 bpm. Avg HR 71 bpm, in sinus rhythm. 26 episodes of SVT/ atrial tachycardia, fastest at 179 bpm for 5 beats, longest for 17 beats at 118 bpm. 1.7% isolated SVE, <1% couplet/triplets. 2 episodes of VT, fastest and longest at 197 bpm for 8 beats. 1.8% isolated VE, <1% couplet/triplets. No atrial fibrillation/atrial flutter/VT/high grade AV block, sinus pause >3sec noted. 0 patient triggered events.     Echocardiogram 06/2024:  1. Left ventricular ejection fraction, by estimation, is 55 to 60%. Left  ventricular ejection fraction by 3D volume is 59 %. The left ventricle has  normal function.  The left ventricle has no regional wall motion  abnormalities. There is mild concentric  left ventricular hypertrophy. Left ventricular diastolic parameters are  consistent with Grade I diastolic dysfunction (impaired relaxation).   2. Right ventricular systolic function is normal. The right ventricular  size is normal. There is normal pulmonary artery systolic pressure. The  estimated right ventricular systolic pressure is 17.0 mmHg.   3. Left atrial size was mildly dilated.   4. The mitral valve is normal in structure. Trivial mitral valve  regurgitation. No evidence of mitral stenosis.   5. The aortic valve is tricuspid. There is moderate calcification of the  aortic valve. Aortic valve regurgitation is not visualized. Aortic valve  sclerosis/calcification is present, without any evidence of aortic  stenosis.   6. The inferior vena cava is normal in size with greater than 50%  respiratory variability, suggesting right atrial pressure of 3 mmHg.    Coronary angiogram 2016: Angiographically only mild to moderate disease involving the proximal circumflex and mid LAD. No culprit lesion to explain angina symptoms. The left ventricular systolic function is normal. Normal LVEDP     Labs 04/2024: Chol 221, TG 82, HDL 52, LDL 154 Hb 13.9 Cr 1.04 Trop HS 4, 4 D-dimer 0.28  2016: Chol 233, TG 272, HDL 37, LDL 142    Physical Exam Vitals and nursing note reviewed.  Constitutional:      General: He is not in acute distress. Neck:     Vascular: No JVD.  Cardiovascular:     Rate and Rhythm:  Normal rate and regular rhythm.     Heart sounds: Normal heart sounds. No murmur heard. Pulmonary:     Effort: Pulmonary effort is normal.     Breath sounds: Normal breath sounds. No wheezing or rales.  Musculoskeletal:     Right lower leg: No edema.     Left lower leg: No edema.      VISIT DIAGNOSES:   ICD-10-CM   1. Mixed hyperlipidemia  E78.2 Lipid panel    2. PSVT (paroxysmal  supraventricular tachycardia)  I47.10     3. NSVT (nonsustained ventricular tachycardia) (HCC)  I47.29         Aaron Lang is a 83 y.o. male with hypertension, hyperlipidemia, GERD, nonobstructive coronary artery disease (cath 2016), referred for palpitations  Assessment & Plan PSVT, NSVT: Short episodes, likely cause of patient's palpitations. Structurally normal heart. Recommend metoprolol succinate 25 mg every night, which should also help with blood pressure to some extent.     Mixed hyperlipidemia: Tolerating Crestor  10 mg daily.  Check lipid panel today.    F/u in 6 months  Signed, Newman JINNY Lawrence, MD

## 2024-08-05 ENCOUNTER — Ambulatory Visit: Payer: Self-pay | Admitting: Cardiology

## 2024-08-07 ENCOUNTER — Ambulatory Visit: Admitting: Physical Therapy

## 2024-08-07 DIAGNOSIS — H8111 Benign paroxysmal vertigo, right ear: Secondary | ICD-10-CM | POA: Insufficient documentation

## 2024-08-07 DIAGNOSIS — H8112 Benign paroxysmal vertigo, left ear: Secondary | ICD-10-CM | POA: Diagnosis not present

## 2024-08-07 NOTE — Therapy (Signed)
 OUTPATIENT PHYSICAL THERAPY VESTIBULAR EVALUATION     Patient Name: Aaron Lang MRN: [id number] DOB:May 16, 1941, 83 y.o., male Today's Date: 08/07/2024  END OF SESSION:  PT End of Session - 08/07/24 1915     Visit Number 1    Number of Visits 4    Date for Recertification  09/01/24    Authorization Type Medicare    Authorization Time Period 08-07-24 - 09-20-24    PT Start Time 1105    PT Stop Time 1147    PT Time Calculation (min) 42 min    Activity Tolerance Patient tolerated treatment well    Behavior During Therapy [facility] for tasks assessed/performed          Past Medical History:  Diagnosis Date   Allergic rhinitis    Allergy    Cataract    Diverticulosis    Duodenitis 10/2012   Esophagitis 10/2012   GERD (gastroesophageal reflux disease)    H. pylori infection    HLD (hyperlipidemia)    Hypertension    Kidney stones    Tubular adenoma    Past Surgical History:  Procedure Laterality Date   BACK SURGERY  2015   stenosis   CARDIAC CATHETERIZATION N/A 07/26/2015   Procedure: Left Heart Cath and Coronary Angiography;  Surgeon: Lang LELON Clay, MD;  Location: [facility] INVASIVE CV LAB;  Service: Cardiovascular;  Laterality: N/A;   COLONOSCOPY     NECK SURGERY  2005   TONSILLECTOMY     Patient Active Problem List   Diagnosis Date Noted   PSVT (paroxysmal supraventricular tachycardia) 08/04/2024   NSVT (nonsustained ventricular tachycardia) (HCC) 08/04/2024   Palpitations 05/18/2024   Irregular heart beat 05/18/2024   Left ventricular hypertrophy 05/18/2024   GERD (gastroesophageal reflux disease) 07/26/2015   Duodenitis 07/26/2015   History of Helicobacter pylori infection 07/26/2015   Chest Pain at rest concerning for Unstable angina (HCC)  07/26/2015   Essential hypertension 07/26/2015   Angina, class III 07/26/2015   Chest pain 07/25/2015   Pain in the chest    Hyperlipidemia     PCP: Loring Tanda Mae, MD REFERRING PROVIDER: Anice Riis,  DO  REFERRING DIAG: [phone] (ICD-10-CM) - Benign paroxysmal positional vertigo of left ear  THERAPY DIAG:  BPPV (benign paroxysmal positional vertigo), right  ONSET DATE: Referral date   Rationale for Evaluation and Treatment: Rehabilitation  SUBJECTIVE:   SUBJECTIVE STATEMENT: Pt reports intermittent vertigo that has been going on for about a year - occurs when he lies on his right side; Pt accompanied by: self  PERTINENT HISTORY: per chart note Vertigo - Dizziness characterized by room-spinning sensation occurs exclusively when sleeping on right side - Vertigo persists as long as he remains on right side and resolves after sitting up for a few minutes - No vertigo during daytime - Son has experienced similar dizziness and was diagnosed with BPPV - MRI of head and cervical spine performed following previous neck surgery in 2000  PAIN:  Are you having pain? No  PRECAUTIONS: None  RED FLAGS: None   WEIGHT BEARING RESTRICTIONS: No  FALLS: Has patient fallen in last 6 months? No  LIVING ENVIRONMENT: Lives with: lives with their spouse Lives in: House/apartment  PLOF: Independent  PATIENT GOALS: resolve the vertigo - learn how to do Epley maneuver  OBJECTIVE:  Note: Objective measures were completed at Evaluation unless otherwise noted.  DIAGNOSTIC FINDINGS: N/A for vertigo  COGNITION: Overall cognitive status: Within functional limits for tasks assessed  GAIT: Gait pattern: [facility]  VESTIBULAR ASSESSMENT:  GENERAL OBSERVATION: Pt amb. Independently  - reports no dizziness at this time; says dizziness comes and goes    SYMPTOM BEHAVIOR:  Subjective history: pt reports he has had episodic recurrent vertigo for past year  Non-Vestibular symptoms: nausea/vomiting  Type of dizziness: Spinning/Vertigo  Frequency: varies   Duration: few minutes   Aggravating factors: Induced by position change: rolling to the right  Relieving factors: head stationary  Progression  of symptoms: unchanged   POSITIONAL TESTING: Right Dix-Hallpike: upbeating, right nystagmus Right Sidelying: no nystagmus Left Sidelying: no nystagmus                                                                                                                              TREATMENT DATE: 08-07-24  Discussed etiology of BPPV - article on BPPV from [employer] given to pt.   Canalith Repositioning:  Epley Right: Number of Reps: 2, Response to Treatment: symptoms improved, and Comment: no nystagmus and no c/o vertigo on 2nd rep  PATIENT EDUCATION: Education details: etiology of BPPV - article from [employer] given  Person educated: Patient Education method: Explanation and Handouts Education comprehension: verbalized understanding  HOME EXERCISE PROGRAM: to be issued as appropriate   GOALS: Goals reviewed with patient? Yes  SHORT TERM GOALS: same as LTG's   LONG TERM GOALS: Target date: 09-01-24  Pt will have a (-) Rt Dix-Hallpike test with no nystagmus and no c/o vertigo to indicate resolution of Rt BPPV. Baseline:  Goal status: INITIAL  2.  Pt will report no dizziness with bed mobility or with any movement. Baseline:  Goal status: INITIAL  3.  Pt will verbalize understanding of Epley maneuver for self treatment prn.  Baseline:  Goal status: INITIAL  ASSESSMENT:  CLINICAL IMPRESSION: Patient is a 83 y.o. gentleman who was seen today for physical therapy evaluation and treatment for dizziness due to Rt BPPV.  Pt had a (+) Rt Dix-Hallpike test with Rt rotary upbeating nystagmus and c/o vertigo, indicative of Rt BPPV posterior canalithiasis.  Pt was treated with 2 reps of Epley maneuver and symptoms appeared to be fully resolved on 2nd rep.  Pt will benefit from PT to continue to assess and treat Rt BPPV.    OBJECTIVE IMPAIRMENTS: dizziness.   ACTIVITY LIMITATIONS: bed mobility and locomotion level  PARTICIPATION LIMITATIONS: N/A  PERSONAL FACTORS: N/A are also affecting  patient's functional outcome.   REHAB POTENTIAL: Excellent  CLINICAL DECISION MAKING: Stable/uncomplicated  EVALUATION COMPLEXITY: Low   PLAN:  PT FREQUENCY: 1x/week  PT DURATION: 4 weeks  PLANNED INTERVENTIONS: 97110-Therapeutic exercises, 97530- Therapeutic activity, [id number]- Neuromuscular re-education, [phone]- Self Care, [id number]- Gait training, [phone]- Canalith repositioning, and Patient/Family education  PLAN FOR NEXT SESSION: recheck Rt BPPV   Devaun Hernandez, [place], PT 08/07/2024, 7:18 PM

## 2024-08-29 ENCOUNTER — Ambulatory Visit: Payer: Self-pay | Admitting: Physical Therapy

## 2024-08-29 DIAGNOSIS — H8111 Benign paroxysmal vertigo, right ear: Secondary | ICD-10-CM | POA: Diagnosis present

## 2024-08-29 NOTE — Therapy (Unsigned)
 OUTPATIENT PHYSICAL THERAPY VESTIBULAR EVALUATION     Patient Name: Aaron Lang MRN: [id number] DOB:1940-12-14, 83 y.o., male Today's Date: 08/29/2024  END OF SESSION:    Past Medical History:  Diagnosis Date   Allergic rhinitis    Allergy    Cataract    Diverticulosis    Duodenitis 10/2012   Esophagitis 10/2012   GERD (gastroesophageal reflux disease)    H. pylori infection    HLD (hyperlipidemia)    Hypertension    Kidney stones    Tubular adenoma    Past Surgical History:  Procedure Laterality Date   BACK SURGERY  2015   stenosis   CARDIAC CATHETERIZATION N/A 07/26/2015   Procedure: Left Heart Cath and Coronary Angiography;  Surgeon: Alm LELON Clay, MD;  Location: [facility] INVASIVE CV LAB;  Service: Cardiovascular;  Laterality: N/A;   COLONOSCOPY     NECK SURGERY  2005   TONSILLECTOMY     Patient Active Problem List   Diagnosis Date Noted   PSVT (paroxysmal supraventricular tachycardia) 08/04/2024   NSVT (nonsustained ventricular tachycardia) (HCC) 08/04/2024   Palpitations 05/18/2024   Irregular heart beat 05/18/2024   Left ventricular hypertrophy 05/18/2024   GERD (gastroesophageal reflux disease) 07/26/2015   Duodenitis 07/26/2015   History of Helicobacter pylori infection 07/26/2015   Chest Pain at rest concerning for Unstable angina (HCC)  07/26/2015   Essential hypertension 07/26/2015   Angina, class III 07/26/2015   Chest pain 07/25/2015   Pain in the chest    Hyperlipidemia     PCP: Loring Tanda Mae, MD REFERRING PROVIDER: Anice Riis, DO  REFERRING DIAG: [phone] (ICD-10-CM) - 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  ONSET DATE: Referral date   Rationale for Evaluation and Treatment: Rehabilitation  SUBJECTIVE:   SUBJECTIVE STATEMENT: Pt reports intermittent vertigo that has been going on for about a year - occurs when he lies on his right side; Pt accompanied by: self  PERTINENT  HISTORY: per chart note Vertigo - Dizziness characterized by room-spinning sensation occurs exclusively when sleeping on right side - Vertigo persists as long as he remains on right side and resolves after sitting up for a few minutes - No vertigo during daytime - Son has experienced similar dizziness and was diagnosed with BPPV - MRI of head and cervical spine performed following previous neck surgery in 2000  PAIN:  Are you having pain? No  PRECAUTIONS: None  RED FLAGS: None   WEIGHT BEARING RESTRICTIONS: No  FALLS: Has patient fallen in last 6 months? No  LIVING ENVIRONMENT: Lives with: lives with their spouse Lives in: House/apartment  PLOF: Independent  PATIENT GOALS: resolve the vertigo - learn how to do Epley maneuver  OBJECTIVE:  Note: Objective measures were completed at Evaluation unless otherwise noted.  DIAGNOSTIC FINDINGS: N/A for vertigo  COGNITION: Overall cognitive status: Within functional limits for tasks assessed  GAIT: Gait pattern: WFL  VESTIBULAR ASSESSMENT:  GENERAL OBSERVATION: Pt amb. Independently  - reports no dizziness at this time; says dizziness comes and goes    SYMPTOM BEHAVIOR:  Subjective history: pt reports he has had episodic recurrent vertigo for past year  Non-Vestibular symptoms: nausea/vomiting  Type of dizziness: Spinning/Vertigo  Frequency: varies   Duration: few minutes   Aggravating factors: Induced by position change: rolling to the right  Relieving factors: head stationary  Progression of symptoms: unchanged   POSITIONAL TESTING: Right Dix-Hallpike: upbeating, right nystagmus Right Sidelying: no nystagmus Left Sidelying: no  nystagmus                                                                                                                              TREATMENT DATE: 08-07-24  Discussed etiology of BPPV - article on BPPV from [facility] given to pt.   Canalith Repositioning:  Epley Right: Number of Reps: 2,  Response to Treatment: symptoms improved, and Comment: no nystagmus and no c/o vertigo on 2nd rep  PATIENT EDUCATION: Education details: etiology of BPPV - article from [employer] given  Person educated: Patient Education method: Explanation and Handouts Education comprehension: verbalized understanding  HOME EXERCISE PROGRAM: to be issued as appropriate   GOALS: Goals reviewed with patient? Yes  SHORT TERM GOALS: same as LTG's   LONG TERM GOALS: Target date: 09-01-24  Pt will have a (-) Rt Dix-Hallpike test with no nystagmus and no c/o vertigo to indicate resolution of Rt BPPV. Baseline:  Goal status: INITIAL  2.  Pt will report no dizziness with bed mobility or with any movement. Baseline:  Goal status: INITIAL  3.  Pt will verbalize understanding of Epley maneuver for self treatment prn.  Baseline:  Goal status: INITIAL  ASSESSMENT:  CLINICAL IMPRESSION: Patient is a 83 y.o. gentleman who was seen today for physical therapy evaluation and treatment for dizziness due to Rt BPPV.  Pt had a (+) Rt Dix-Hallpike test with Rt rotary upbeating nystagmus and c/o vertigo, indicative of Rt BPPV posterior canalithiasis.  Pt was treated with 2 reps of Epley maneuver and symptoms appeared to be fully resolved on 2nd rep.  Pt will benefit from PT to continue to assess and treat Rt BPPV.    OBJECTIVE IMPAIRMENTS: dizziness.   ACTIVITY LIMITATIONS: bed mobility and locomotion level  PARTICIPATION LIMITATIONS: N/A  PERSONAL FACTORS: N/A are also affecting patient's functional outcome.   REHAB POTENTIAL: Excellent  CLINICAL DECISION MAKING: Stable/uncomplicated  EVALUATION COMPLEXITY: Low   PLAN:  PT FREQUENCY: 1x/week  PT DURATION: 4 weeks  PLANNED INTERVENTIONS: 97110-Therapeutic exercises, 97530- Therapeutic activity, [id number]- Neuromuscular re-education, [phone]- Self Care, [id number]- Gait training, [phone]- Canalith repositioning, and Patient/Family education  PLAN FOR NEXT SESSION:  recheck Rt BPPV   Aaron Lang, [place], PT 08/29/2024, 9:39 AM

## 2024-08-29 NOTE — Patient Instructions (Signed)
Sit to Side-Lying    Sit on edge of bed. 1. Turn head 45 to right. 2. Maintain head position and lie down slowly on left side. Hold until symptoms subside. 3. Sit up slowly. Hold until symptoms subside. 4. Turn head 45 to left. 5. Maintain head position and lie down slowly on right side. Hold until symptoms subside. 6. Sit up slowly. Repeat sequence __5__ times per session. Do __2-3__ sessions per day.  Copyright  [employer]. All rights reserved.   

## 2024-08-30 ENCOUNTER — Encounter: Payer: Self-pay | Admitting: Physical Therapy

## 2024-09-11 ENCOUNTER — Ambulatory Visit (INDEPENDENT_AMBULATORY_CARE_PROVIDER_SITE_OTHER)

## 2024-09-29 ENCOUNTER — Ambulatory Visit (INDEPENDENT_AMBULATORY_CARE_PROVIDER_SITE_OTHER)

## 2024-09-29 ENCOUNTER — Encounter (INDEPENDENT_AMBULATORY_CARE_PROVIDER_SITE_OTHER): Payer: Self-pay

## 2024-09-29 VITALS — BP 149/71 | HR 72 | Wt 143.0 lb

## 2024-09-29 DIAGNOSIS — R208 Other disturbances of skin sensation: Secondary | ICD-10-CM | POA: Diagnosis not present

## 2024-09-29 DIAGNOSIS — H6122 Impacted cerumen, left ear: Secondary | ICD-10-CM

## 2024-09-29 DIAGNOSIS — H8112 Benign paroxysmal vertigo, left ear: Secondary | ICD-10-CM | POA: Diagnosis not present

## 2024-09-29 MED ORDER — CAPSAICIN 0.1 % EX CREA: 1.0000 | TOPICAL_CREAM | Freq: Two times a day (BID) | CUTANEOUS | 3 refills | Status: AC

## 2024-09-29 NOTE — Patient Instructions (Addendum)
" °  VISIT SUMMARY: During your visit, we discussed the improvement in your vertigo symptoms, ongoing right-sided ear pain and burning, and occasional earwax buildup. We reviewed your progress and provided recommendations to manage these issues.  YOUR PLAN: -BENIGN PAROXYSMAL POSITIONAL VERTIGO OF EAR: This condition causes brief episodes of dizziness related to changes in head position. Your vertigo has improved significantly after physical therapy, but you still experience occasional lightheadedness. We discussed the importance of continuing home exercises if symptoms return and recommended taking vitamin D supplements (1000-2000 IU daily) to help prevent recurrence.  -BURNING SENSATION OF left EAR (POSSIBLE RED EAR SYNDROME): This condition involves episodic burning and redness of the ear, often triggered by heat. We recommended trying topical capsaicin  0.1% cream, applied twice daily, to help alleviate the symptoms. A prescription for the cream was provided, and we discussed its over-the-counter availability.  -IMPACTED CERUMEN, left EAR: This refers to the buildup of earwax that can occasionally cause discomfort or hearing issues. We suggested using mineral oil, sweet oil, or baby oil for mild buildup, and hydrogen peroxide or over-the-counter Debrox if the wax becomes fully impacted. A prescription for fluocinolone oil 0.1% was offered if needed.  INSTRUCTIONS: Please follow up with us  if your symptoms persist or worsen. Continue with the recommended treatments and let us  know if you have any questions or concerns.    - Vitamin D deficiency is prevalent in BPPV patients and is associated with higher recurrence rates. Correction of deficiency has been shown to significantly reduce BPPV recurrence and improve physical function. - Initial Management: Canalith repositioning procedure (CRP) performed as first-line therapy per [employer] Academy of Otolaryngology-Head and Neck Surgery guidelines. -   Vitamin D Testing, indicated due to history of BPPV and/or risk factors (older age, osteoporosis, recurrent episodes). - Deficiency defined as 25(OH)D < 20 ng/mL; insufficiency as 20-30 ng/mL; sufficiency as >=30 ng/mL.[5-6] - Desired Vitamin D Level: Target serum 25(OH)D >= 20 ng/mL to reduce recurrence risk and optimize bone health.  - Possible supplementation Plan: -If 25(OH)D < 20 ng/mL, initiate vitamin D supplementation (e.g., [phone] IU daily, titrated to achieve target level) and consider calcium  supplementation as per trial protocols. -Recheck 25(OH)D in 3-6 months to confirm adequacy. - Monitor for recurrence of BPPV symptoms and reassess vitamin D status as indicated. - Reinforced fall prevention strategies                     Contains text generated by [employer].                                 Contains text generated by [employer].   "

## 2024-10-01 NOTE — Progress Notes (Signed)
 Dear Dr. Loring, Here is my assessment for our mutual patient, Aaron Lang. Thank you for allowing me the opportunity to care for your patient. Please do not hesitate to contact me should you have any other questions. Sincerely, Dr. Penne Croak  Otolaryngology Clinic Note Referring provider: Dr. Loring HPI:  Discussed the use of AI scribe software for clinical note transcription with the patient, who gave verbal consent to proceed.  History of Present Illness Aaron Lang is an 84 year old male who presents with dizziness and burning sensation in the ear. He was referred by Dr. Loring for evaluation of dizziness.  Vertigo - Dizziness characterized by room-spinning sensation occurs exclusively when sleeping on right side - Vertigo persists as long as he remains on right side and resolves after sitting up for a few minutes - No vertigo during daytime - Son has experienced similar dizziness and was diagnosed with BPPV - MRI of head and cervical spine performed following previous neck surgery in 2000  Red ear syndrome - Burning sensation primarily affects right ear - Triggered by heat and warmth, especially at night - Burning can last up to an hour - Relief achieved by applying a cold cloth to the ear - Burning occurs most nights and sometimes during the day if exposed to heat without ear protection - Uses pillow with a hole to alleviate symptoms when sleeping on left side - No pain  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  Vertigo and Disequilibrium: - Significant improvement in vertigo symptoms  following completion of physical therapy - Spinning sensations have resolved - Occasional mild lightheadedness persists upon transitioning from lying to sitting or standing - Not currently taking vitamin D supplementation  Right Otalgia and Auricular Burning: - Episodic burning and erythema of the right ear, primarily at night and occasionally during the day - Heat identified as a trigger for symptoms - Topical deodorant and aloe vera provide partial relief - Application of a cold cloth is highly effective in alleviating symptoms - Symptoms have not fully resolved - No use of capsaicin  cream to date - No associated headaches, migraines, or burning mouth symptoms - No history of ear surgery or herpes zoster infection - no pain  Cerumen Impaction: - Occasional cerumen accumulation in the right ear - Symptoms are infrequent and not severe - Previously used over-the-counter treatments - No requirement for prescription therapy for impacted cerumen   PMH/Meds/All/SocHx/FamHx/ROS:   Past Medical History:  Diagnosis Date   Allergic rhinitis    Allergy    Cataract    Diverticulosis    Duodenitis 10/2012   Esophagitis 10/2012   GERD (gastroesophageal reflux disease)    H. pylori infection    HLD (hyperlipidemia)    Hypertension    Kidney stones    Tubular adenoma      Past Surgical History:  Procedure Laterality Date   BACK SURGERY  2015   stenosis   CARDIAC CATHETERIZATION N/A 07/26/2015   Procedure: Left Heart Cath and Coronary Angiography;  Surgeon: Alm LELON Clay, MD;  Location: [facility] INVASIVE CV LAB;  Service: Cardiovascular;  Laterality: N/A;   COLONOSCOPY  NECK SURGERY  2005   TONSILLECTOMY      Family History  Problem Relation Age of Onset   Diabetes Mother    Heart disease Father    Diabetes Brother    Colon polyps Brother    Colon cancer Neg Hx    Stomach cancer Neg Hx    Rectal cancer Neg Hx    Esophageal cancer Neg Hx      Social Connections: Not on  file      Current Outpatient Medications:    Capsaicin  0.1 % CREA, Apply 1 each topically in the morning and at bedtime., Disp: 42.5 g, Rfl: 3   famotidine  (PEPCID ) 20 MG tablet, Take 20 mg by mouth daily., Disp: , Rfl:    fluticasone (FLONASE) 50 MCG/ACT nasal spray, Place 1 spray into both nostrils as needed for allergies or rhinitis., Disp: , Rfl:    Lidocaine -Aloe Vera 0.5 % GEL, Apply 1 Dose topically as needed., Disp: 226 g, Rfl: 0   loratadine (CLARITIN) 10 MG tablet, Take 10 mg by mouth as needed for allergies., Disp: , Rfl:    metoprolol  succinate (TOPROL  XL) 25 MG 24 hr tablet, Take 1 tablet (25 mg total) by mouth at bedtime., Disp: 90 tablet, Rfl: 3   Multiple Vitamin (MULTIVITAMIN) tablet, Take 1 tablet by mouth daily., Disp: , Rfl:    rosuvastatin  (CRESTOR ) 10 MG tablet, Take 1 tablet (10 mg total) by mouth daily., Disp: 90 tablet, Rfl: 3   sucralfate  (CARAFATE ) 1 g tablet, Take 1 g by mouth daily., Disp: , Rfl:    Physical Exam:   BP (!) 149/71 (BP Location: Left Arm, Patient Position: Sitting, Cuff Size: Normal)   Pulse 72   Wt 143 lb (64.9 kg)   SpO2 97%   BMI 22.40 kg/m   The patient was awake, alert, and appropriate. The external ears were inspected, and otoscopy was performed to evaluate the external auditory canals and tympanic membranes. The nasal cavity and septum were examined for mucosal changes, obstruction, or discharge. The oral cavity and oropharynx were inspected for mucosal lesions, infection, or tonsillar hypertrophy. The neck was palpated for lymphadenopathy, thyroid abnormalities, or other masses. Cranial nerve function was grossly intact.  Pertinent Findings: Physical Exam  HEENT: Atraumatic, normocephalic. External auditory canals clean with some dryness noted. Cerumen present in one ear. Tympanic membranes with normal landmarks. Nose normal. Oral cavity normal. Dix hallpike negative b/l  Seprately Identifiable Procedures:  I personally ordered,  reviewed and interpreted the following with the patient today  Procedure: Bilateral ear microscopy and cerumen removal using microscope (CPT [phone]) - Mod 25 Pre-procedure diagnosis: Cerumen impaction left external ear/ears Post-procedure diagnosis: same Indication: left cerumen impaction; given patient's otologic complaints and history as well as for improved and comprehensive examination of external ear and tympanic membrane, bilateral otologic examination using microscope was performed and impacted cerumen removed  Procedure: Patient was placed semi-recumbent. Both ear canals were examined using the microscope with findings above. Cerumen removed on left using suction and currette with improvement in EAC examination and patency. Left: EAC was patent. TM was intact . Middle ear was aerated. Drainage: absent Right: EAC was patent. TM was intact . Middle ear was aerated . Drainage: absent Patient tolerated the procedure well.  Impression & Plans:  Aaron Lang is a 84 y.o. male  1. Burning sensation   2. 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 Left ear impacted cerumen    - Findings and diagnoses discussed  in detail with the patient. - Risks, benefits, and alternatives were reviewed. Through shared decision making, the patient elects to proceed with below. 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 Occasional mild lightheadedness noted. Discussed vitamin D deficiency's link to BPPV recurrence. - Reviewed benefit of physical therapy and home exercises. - Reinforced use of exercises if symptoms recur. - Provided education on vitamin D supplementation to reduce BPPV attacks. - Recommended vitamin D supplementation (1000-2000 IU daily).   - Vitamin D deficiency is prevalent in BPPV patients and is associated with higher recurrence rates. Correction of deficiency has been shown to significantly reduce  BPPV recurrence and improve physical function. - Initial Management: Canalith repositioning procedure (CRP) performed as first-line therapy per American Academy of Otolaryngology-Head and Neck Surgery guidelines. -  Vitamin D Testing, indicated due to history of BPPV and/or risk factors (older age, osteoporosis, recurrent episodes). - Deficiency defined as 25(OH)D < 20 ng/mL; insufficiency as 20-30 ng/mL; sufficiency as >=30 ng/mL.[5-6] - Desired Vitamin D Level: Target serum 25(OH)D >= 20 ng/mL to reduce recurrence risk and optimize bone health.  - Possible supplementation Plan: -If 25(OH)D < 20 ng/mL, initiate vitamin D supplementation (e.g., [phone] IU daily, titrated to achieve target level) and consider calcium  supplementation as per trial protocols. -Recheck 25(OH)D in 3-6 months to confirm adequacy. - Monitor for recurrence of BPPV symptoms and reassess vitamin D status as indicated. - Reinforced fall prevention strategies   Burning sensation of right ear (possible red ear syndrome) Persistent episodic nocturnal burning of right ear, heat-triggered, partially relieved by topical treatments. Gabapentin and beta-blockers discussed but not recommended; topical capsaicin  preferred. - Recommended trial of topical capsaicin  0.1% cream, applied twice daily. - Provided prescription for capsaicin  cream with refills. - Discussed over-the-counter availability of capsaicin .  Impacted cerumen, left ear Occasional cerumen accumulation without significant impaction or hearing loss. Right ear canal dry with some wax present. - Recommended mineral oil, sweet oil, or baby oil for mild cerumen buildup. - Advised hydrogen peroxide or over-the-counter Debrox if cerumen becomes fully impacted. - Offered prescription fluocinolone oil 0.1% if needed.  - Orders placed:  No orders of the defined types were placed in this encounter.  - Medications prescribed/continued/adjusted:  Meds ordered this encounter   Medications   Capsaicin  0.1 % CREA    Sig: Apply 1 each topically in the morning and at bedtime.    Dispense:  42.5 g    Refill:  3   - Education materials provided to the patient. - Follow up: 3 months. Patient instructed to return sooner or go to the ED if new/worsening symptoms develop.  Thank you for allowing me the opportunity to care for your patient. Please do not hesitate to contact me should you have any other questions.  Sincerely, Penne Croak, DO Otolaryngologist (ENT) [facility] Phone: [phone] Fax: [phone]  10/01/2024, 3:04 PM

## 2024-10-11 ENCOUNTER — Other Ambulatory Visit (HOSPITAL_COMMUNITY): Payer: Self-pay
